# Patient Record
Sex: Female | Born: 1965
Health system: Southern US, Community
[De-identification: ages and names within clinical notes are randomized; demographics above are authoritative.]

## PROBLEM LIST (undated history)

## (undated) DIAGNOSIS — F419 Anxiety disorder, unspecified: Secondary | ICD-10-CM

## (undated) DIAGNOSIS — I10 Essential (primary) hypertension: Secondary | ICD-10-CM

## (undated) DIAGNOSIS — E785 Hyperlipidemia, unspecified: Secondary | ICD-10-CM

## (undated) HISTORY — PX: TUBAL LIGATION: SHX77

## (undated) HISTORY — DX: Essential (primary) hypertension: I10

## (undated) HISTORY — DX: Hyperlipidemia, unspecified: E78.5

## (undated) HISTORY — DX: Anxiety disorder, unspecified: F41.9

## (undated) HISTORY — PX: TONSILLECTOMY: SUR1361

---

## 2005-02-11 ENCOUNTER — Ambulatory Visit: Payer: Self-pay | Admitting: Family Medicine

## 2017-03-27 ENCOUNTER — Other Ambulatory Visit: Payer: Self-pay

## 2017-03-27 ENCOUNTER — Encounter: Payer: Self-pay | Admitting: Emergency Medicine

## 2017-03-27 ENCOUNTER — Emergency Department
Admission: EM | Admit: 2017-03-27 | Discharge: 2017-03-27 | Disposition: A | Discharge status: Home / Self Care | Payer: BLUE CROSS/BLUE SHIELD | Attending: Emergency Medicine | Admitting: Emergency Medicine

## 2017-03-27 DIAGNOSIS — M5432 Sciatica, left side: Secondary | ICD-10-CM | POA: Diagnosis not present

## 2017-03-27 DIAGNOSIS — F17200 Nicotine dependence, unspecified, uncomplicated: Secondary | ICD-10-CM | POA: Diagnosis not present

## 2017-03-27 DIAGNOSIS — M545 Low back pain: Secondary | ICD-10-CM | POA: Diagnosis present

## 2017-03-27 MED ORDER — METHOCARBAMOL 500 MG PO TABS: ORAL_TABLET | ORAL | 0 refills | Status: DC

## 2017-03-27 MED ORDER — PREDNISONE 10 MG PO TABS: ORAL_TABLET | ORAL | 0 refills | Status: DC

## 2017-03-27 MED ORDER — METHOCARBAMOL 500 MG PO TABS
1000.0000 mg | ORAL_TABLET | Freq: Once | ORAL | Status: AC
  Administered 2017-03-27: 1000 mg via ORAL
  Filled 2017-03-27: qty 2

## 2017-03-27 MED ORDER — KETOROLAC TROMETHAMINE 30 MG/ML IJ SOLN
30.0000 mg | Freq: Once | INTRAMUSCULAR | Status: AC
  Administered 2017-03-27: 30 mg via INTRAMUSCULAR
  Filled 2017-03-27: qty 1

## 2017-03-27 NOTE — Discharge Instructions (Signed)
With [facility] clinic or Dr. Posey Pronto who is the orthopedist on call for the Orthopedic Department. Begin taking prednisone as we discussed starting with 6 tablets today. You may also take Robaxin 1 tablet at at bedtime for muscle spasms as needed.

## 2017-03-27 NOTE — ED Notes (Signed)
Patient feels relief of symptoms when patient "leans" to the right

## 2017-03-27 NOTE — ED Provider Notes (Signed)
[facility] Emergency Department Provider Note  ____________________________________________   First MD Initiated Contact with Patient 03/27/17 1405     (approximate)  I have reviewed the triage vital signs and the nursing notes.   HISTORY  Chief Complaint Back Pain   HPI [place] is a 51 y.o. female  is here complaining of low back pain with left leg radiculopathy for approximately 3 weeks. Patient states he is gotten worse over the last 3-4 days.Patient states that the pain is worse when she is standing. Patient has been taking over-the-counter medications such as Aleve, Tylenol and BC back and body with minimal relief. Patient denies any recent injuries. She denies any incontinence of bowel or bladder, saddle anesthesias. currently she rates her pain as a 1/10.   History reviewed. No pertinent past medical history.  There are no active problems to display for this patient.   Past Surgical History:  Procedure Laterality Date  . CESAREAN SECTION    . TONSILLECTOMY      Prior to Admission medications   Medication Sig Start Date End Date Taking? Authorizing Provider  methocarbamol (ROBAXIN) 500 MG tablet Take 1 tablet at [facility] 03/27/17   Letitia Neri L, PA-C  predniSONE (DELTASONE) 10 MG tablet Take 6 tablets  today, on day 2 take 5 tablets, day 3 take 4 tablets, day 4 take 3 tablets, day 5 take  2 tablets and 1 tablet the last day 03/27/17   Johnn Hai, PA-C    Allergies Patient has no known allergies.  No family history on file.  Social History Social History   Tobacco Use  . Smoking status: Current Every Day Smoker  . Smokeless tobacco: Never Used  Substance Use Topics  . Alcohol use: No    Frequency: Never  . Drug use: No    Review of Systems Constitutional: No fever/chills Cardiovascular: Denies chest pain. Respiratory: Denies shortness of breath. Gastrointestinal: No abdominal pain.  No nausea, no vomiting.    Genitourinary: Negative for dysuria. Musculoskeletal: positive low back pain with left leg radiculopathy. Skin: Negative for rash. Neurological: Negative for  focal weakness or numbness. ___________________________________________   PHYSICAL EXAM:  VITAL SIGNS: ED Triage Vitals  Enc Vitals Group     BP 03/27/17 1306 (!) 174/94     Pulse Rate 03/27/17 1306 87     Resp --      Temp 03/27/17 1306 97.9 F (36.6 C)     Temp Source 03/27/17 1306 Oral     SpO2 03/27/17 1306 95 %     Weight 03/27/17 1307 225 lb (102.1 kg)     Height 03/27/17 1307 5\' 6"  (1.676 m)     Head Circumference --      Peak Flow --      Pain Score 03/27/17 1312 1     Pain Loc --      Pain Edu? --      Excl. in [place]? --    Constitutional: Alert and oriented. Well appearing and in no acute distress. Eyes: Conjunctivae are normal.  Head: Atraumatic. Neck: No stridor.   Cardiovascular: Normal rate, regular rhythm. Grossly normal heart sounds.  Good peripheral circulation. Respiratory: Normal respiratory effort.  No retractions. Lungs CTAB. Gastrointestinal: Soft and nontender. No distention. No abdominal bruits. No CVA tenderness. Musculoskeletal: Moves upper and lower extremities without any difficulty.Gait was not tested secondary to patient's low back pain. On examination of the back there is no gross deformity noted. There is  tenderness on palpation of the lower lumbar paravertebral muscles over to the left SI joint and surrounding soft tissue. Range of motion is with minimal restriction. Straight leg raises were negative.Good muscle strength bilaterally. Neurologic:  Normal speech and language. No gross focal neurologic deficits are appreciated. eflexes are 2+ bilaterally.No gait instability. Skin:  Skin is warm, dry and intact.  Psychiatric: Mood and affect are normal. Speech and behavior are normal.  ____________________________________________   LABS (all labs ordered are listed, but only abnormal  results are displayed)  Labs Reviewed - No data to display  ____________________________________________   PROCEDURES  Procedure(s) performed: None  Procedures  Critical Care performed: No  ____________________________________________   INITIAL IMPRESSION / ASSESSMENT AND PLAN / ED COURSE Discussed sciatica with patient and family members present. Patient was given Toradol 30 mg IM.patient was also given Robaxin thousand milligrams by mouth while in the department. Patient was discharged with a prescription for prednisone 60 mg 6 day taper along with Robaxin 1 tablet at bedtime. She is to follow-up with her PCP, [facility] clinic or with the orthopedic Department at [facility] clinic if any continued problems with her back or sciatica.   ____________________________________________   FINAL CLINICAL IMPRESSION(S) / ED DIAGNOSES  Final diagnoses:  Sciatica of left side     ED Discharge Orders        Ordered    predniSONE (DELTASONE) 10 MG tablet     03/27/17 1546    methocarbamol (ROBAXIN) 500 MG tablet     03/27/17 1546       Note:  This document was prepared using Dragon voice recognition software and may include unintentional dictation errors.    Johnn Hai, PA-C 03/27/17 1618    Lisa Roca, MD 03/31/17 [phone]

## 2017-03-27 NOTE — ED Triage Notes (Signed)
Pt to ED c/o lower back pain with left leg radiculopathy x 2 weeks but worsening over the past 3-4 days. Pt states that pain is worse when she stands. Pt in NAD at this time.

## 2019-07-10 ENCOUNTER — Ambulatory Visit: Payer: Self-pay | Admitting: Family Medicine

## 2019-07-12 ENCOUNTER — Other Ambulatory Visit (HOSPITAL_COMMUNITY)
Admission: RE | Admit: 2019-07-12 | Discharge: 2019-07-12 | Disposition: A | Discharge status: Home / Self Care | Payer: BC Managed Care – PPO | Source: Ambulatory Visit | Attending: Family Medicine | Admitting: Family Medicine

## 2019-07-12 ENCOUNTER — Ambulatory Visit: Payer: BC Managed Care – PPO | Admitting: Family Medicine

## 2019-07-12 ENCOUNTER — Other Ambulatory Visit: Payer: Self-pay

## 2019-07-12 ENCOUNTER — Encounter: Payer: Self-pay | Admitting: Family Medicine

## 2019-07-12 VITALS — BP 166/97 | HR 89 | Temp 98.2°F | Ht 64.0 in | Wt 224.0 lb

## 2019-07-12 DIAGNOSIS — N898 Other specified noninflammatory disorders of vagina: Secondary | ICD-10-CM | POA: Diagnosis not present

## 2019-07-12 DIAGNOSIS — Z114 Encounter for screening for human immunodeficiency virus [HIV]: Secondary | ICD-10-CM | POA: Diagnosis not present

## 2019-07-12 DIAGNOSIS — Z1231 Encounter for screening mammogram for malignant neoplasm of breast: Secondary | ICD-10-CM | POA: Diagnosis not present

## 2019-07-12 DIAGNOSIS — R030 Elevated blood-pressure reading, without diagnosis of hypertension: Secondary | ICD-10-CM

## 2019-07-12 DIAGNOSIS — Z1159 Encounter for screening for other viral diseases: Secondary | ICD-10-CM | POA: Diagnosis not present

## 2019-07-12 DIAGNOSIS — Z124 Encounter for screening for malignant neoplasm of cervix: Secondary | ICD-10-CM

## 2019-07-12 DIAGNOSIS — Z0000 Encounter for general adult medical examination without abnormal findings: Secondary | ICD-10-CM

## 2019-07-12 DIAGNOSIS — Z23 Encounter for immunization: Secondary | ICD-10-CM

## 2019-07-12 DIAGNOSIS — Z1211 Encounter for screening for malignant neoplasm of colon: Secondary | ICD-10-CM

## 2019-07-12 LAB — UA/M W/RFLX CULTURE, ROUTINE
Bilirubin, UA: NEGATIVE
Glucose, UA: NEGATIVE
Ketones, UA: NEGATIVE
Leukocytes,UA: NEGATIVE
Nitrite, UA: NEGATIVE
Protein,UA: NEGATIVE
Specific Gravity, UA: 1.02 (ref 1.005–1.030)
Urobilinogen, Ur: 0.2 mg/dL (ref 0.2–1.0)
pH, UA: 7 (ref 5.0–7.5)

## 2019-07-12 LAB — MICROSCOPIC EXAMINATION

## 2019-07-12 LAB — WET PREP FOR TRICH, YEAST, CLUE
Clue Cell Exam: POSITIVE — AB
Trichomonas Exam: NEGATIVE
Yeast Exam: NEGATIVE

## 2019-07-12 NOTE — Progress Notes (Signed)
BP (!) 166/97   Pulse 89   Temp 98.2 F (36.8 C) (Oral)   Ht 5\' 4"  (1.626 m)   Wt 224 lb (101.6 kg)   SpO2 98%   BMI 38.45 kg/m    Subjective:    Patient ID: Tiffany Boyer, female    DOB: January 07, 1966, 54 y.o.   MRN: [id number]  HPI: Tiffany Boyer is a 54 y.o. female presenting on 07/12/2019 for comprehensive medical examination. Current medical complaints include:see below  Patient presenting today to establish care.   No known medical problems, has not been to a doctor in 15 years per patient. Denies any new sxs or concerns.   She currently lives with: Menopausal Symptoms: no  Depression Screen done today and results listed below:  Depression screen [facility] 2/9 07/12/2019  Decreased Interest 2  Down, Depressed, Hopeless 0  PHQ - 2 Score 2  Altered sleeping 3  Tired, decreased energy 0  Change in appetite 1  Feeling bad or failure about yourself  0  Trouble concentrating 0  Moving slowly or fidgety/restless 0  Suicidal thoughts 0  PHQ-9 Score 6    The patient does not have a history of falls. I did complete a risk assessment for falls. A plan of care for falls was documented.   Past Medical History:  Past Medical History:  Diagnosis Date  . Hyperlipidemia     Surgical History:  Past Surgical History:  Procedure Laterality Date  . CESAREAN SECTION    . TONSILLECTOMY      Medications:  Current Outpatient Medications on File Prior to Visit  Medication Sig  . Multiple Vitamins-Minerals (WOMENS 50+ MULTI VITAMIN/MIN PO) Take by mouth daily.   No current facility-administered medications on file prior to visit.    Allergies:  No Known Allergies  Social History:  Social History   Socioeconomic History  . Marital status: Married    Spouse name: Not on file  . Number of children: Not on file  . Years of education: Not on file  . Highest education level: Not on file  Occupational History  . Not on file  Tobacco Use  . Smoking status: Current Every Day  Smoker    Packs/day: 0.50    Types: Cigarettes  . Smokeless tobacco: Never Used  Substance and Sexual Activity  . Alcohol use: No  . Drug use: No  . Sexual activity: Yes  Other Topics Concern  . Not on file  Social History Narrative  . Not on file   Social Determinants of Health   Financial Resource Strain:   . Difficulty of Paying Living Expenses: Not on file  Food Insecurity:   . Worried About Charity fundraiser in the Last Year: Not on file  . Ran Out of Food in the Last Year: Not on file  Transportation Needs:   . Lack of Transportation (Medical): Not on file  . Lack of Transportation (Non-Medical): Not on file  Physical Activity:   . Days of Exercise per Week: Not on file  . Minutes of Exercise per Session: Not on file  Stress:   . Feeling of Stress : Not on file  Social Connections:   . Frequency of Communication with Friends and Family: Not on file  . Frequency of Social Gatherings with Friends and Family: Not on file  . Attends Religious Services: Not on file  . Active Member of Clubs or Organizations: Not on file  . Attends Archivist Meetings: Not  on file  . Marital Status: Not on file  Intimate Partner Violence:   . Fear of Current or Ex-Partner: Not on file  . Emotionally Abused: Not on file  . Physically Abused: Not on file  . Sexually Abused: Not on file   Social History   Tobacco Use  Smoking Status Current Every Day Smoker  . Packs/day: 0.50  . Types: Cigarettes  Smokeless Tobacco Never Used   Social History   Substance and Sexual Activity  Alcohol Use No    Family History:  Family History  Problem Relation Age of Onset  . Hypertension Mother   . Stroke Brother     Past medical history, surgical history, medications, allergies, family history and social history reviewed with patient today and changes made to appropriate areas of the chart.   Review of Systems - General ROS: negative Psychological ROS: negative Ophthalmic  ROS: negative ENT ROS: negative Allergy and Immunology ROS: negative Hematological and Lymphatic ROS: negative Endocrine ROS: negative Breast ROS: negative for breast lumps Respiratory ROS: no cough, shortness of breath, or wheezing Cardiovascular ROS: no chest pain or dyspnea on exertion Gastrointestinal ROS: no abdominal pain, change in bowel habits, or black or bloody stools Genito-Urinary ROS: no dysuria, trouble voiding, or hematuria Musculoskeletal ROS: negative Neurological ROS: no TIA or stroke symptoms Dermatological ROS: negative All other ROS negative except what is listed above and in the HPI.      Objective:    BP (!) 166/97   Pulse 89   Temp 98.2 F (36.8 C) (Oral)   Ht 5\' 4"  (1.626 m)   Wt 224 lb (101.6 kg)   SpO2 98%   BMI 38.45 kg/m   Wt Readings from Last 3 Encounters:  07/12/19 224 lb (101.6 kg)  03/27/17 225 lb (102.1 kg)    Physical Exam Vitals and nursing note reviewed.  Constitutional:      General: She is not in acute distress.    Appearance: She is well-developed.  HENT:     Head: Atraumatic.     Right Ear: External ear normal.     Left Ear: External ear normal.     Nose: Nose normal.     Mouth/Throat:     Pharynx: No oropharyngeal exudate.  Eyes:     General: No scleral icterus.    Conjunctiva/sclera: Conjunctivae normal.     Pupils: Pupils are equal, round, and reactive to light.  Neck:     Thyroid: No thyromegaly.  Cardiovascular:     Rate and Rhythm: Normal rate and regular rhythm.     Heart sounds: Normal heart sounds.  Pulmonary:     Effort: Pulmonary effort is normal. No respiratory distress.     Breath sounds: Normal breath sounds.  Chest:     Breasts:        Right: No mass, skin change or tenderness.        Left: No mass, skin change or tenderness.  Abdominal:     General: Bowel sounds are normal.     Palpations: Abdomen is soft. There is no mass.     Tenderness: There is no abdominal tenderness.  Genitourinary:     General: Normal vulva.     Vagina: Vaginal discharge present.  Musculoskeletal:        General: No tenderness. Normal range of motion.     Cervical back: Normal range of motion and neck supple.  Lymphadenopathy:     Cervical: No cervical adenopathy.     Upper  Body:     Right upper body: No axillary adenopathy.     Left upper body: No axillary adenopathy.  Skin:    General: Skin is warm and dry.     Findings: No rash.  Neurological:     Mental Status: She is alert and oriented to person, place, and time.     Cranial Nerves: No cranial nerve deficit.  Psychiatric:        Behavior: Behavior normal.     Results for orders placed or performed in visit on 07/12/19  WET PREP FOR [place], YEAST, CLUE   Specimen: Vaginal Fluid   VAGINAL FLUI  Result Value Ref Range   Trichomonas Exam Negative Negative   Yeast Exam Negative Negative   Clue Cell Exam Positive (A) Negative  Microscopic Examination   VAGINAL FLUI  Result Value Ref Range   WBC, UA 0-5 0 - 5 /hpf   RBC 11-30 (A) 0 - 2 /hpf   Epithelial Cells (non renal) 0-10 0 - 10 /hpf   Casts Present None seen /lpf   Cast Type Granular casts (A) N/A   Crystals Present N/A   Crystal Type Amorphous Sediment N/A   Mucus, UA Present Not Estab.   Bacteria, UA Few (A) None seen/Few  UA/M w/rflx Culture, Routine   Specimen: Vaginal Fluid   VAGINAL FLUI  Result Value Ref Range   Specific Gravity, UA 1.020 1.005 - 1.030   pH, UA 7.0 5.0 - 7.5   Color, UA Yellow Yellow   Appearance Ur Hazy (A) Clear   Leukocytes,UA Negative Negative   Protein,UA Negative Negative/Trace   Glucose, UA Negative Negative   Ketones, UA Negative Negative   RBC, UA 2+ (A) Negative   Bilirubin, UA Negative Negative   Urobilinogen, Ur 0.2 0.2 - 1.0 mg/dL   Nitrite, UA Negative Negative   Microscopic Examination See below:       Assessment & Plan:   Problem List Items Addressed This Visit    None    Visit Diagnoses    Transient elevated blood pressure     -  Primary   Elevated today, will try to obtain home readings for several weeks and call with persistent abnormal readings. DASH diet, exercise reviewed   Annual physical exam       Relevant Orders   CBC with Differential/Platelet   Comprehensive metabolic panel   Lipid Panel w/o Chol/HDL Ratio   TSH   UA/M w/rflx Culture, Routine (Completed)   Vaginal discharge       Asymptomatic. Await wet prep results, treat as needed   Relevant Orders   WET PREP FOR [place], YEAST, CLUE (Completed)   Encounter for screening mammogram for malignant neoplasm of breast       Relevant Orders   MM DIGITAL SCREENING BILATERAL   Screening for HIV (human immunodeficiency virus)       Relevant Orders   HIV Antibody (routine testing w rflx)   Need for hepatitis C screening test       Relevant Orders   Hepatitis C antibody   Screening for colon cancer       Relevant Orders   Cologuard   Screening for cervical cancer       Relevant Orders   Cytology - PAP   Need for diphtheria-tetanus-pertussis (Tdap) vaccine       Relevant Orders   Tdap vaccine greater than or equal to 7yo IM (Completed)       Follow up plan: Return in  about 6 months (around 01/09/2020) for BP.   LABORATORY TESTING:  - Pap smear: pap done  IMMUNIZATIONS:   - Tdap: Tetanus vaccination status reviewed: Td vaccination indicated and given today. - Influenza: Refused  SCREENING: -Mammogram: Ordered today  - Colonoscopy: Ordered today   PATIENT COUNSELING:   Advised to take 1 mg of folate supplement per day if capable of pregnancy.   Sexuality: Discussed sexually transmitted diseases, partner selection, use of condoms, avoidance of unintended pregnancy  and contraceptive alternatives.   Advised to avoid cigarette smoking.  I discussed with the patient that most people either abstain from alcohol or drink within safe limits (<=14/week and <=4 drinks/occasion for males, <=7/weeks and <= 3 drinks/occasion for females) and that  the risk for alcohol disorders and other health effects rises proportionally with the number of drinks per week and how often a drinker exceeds daily limits.  Discussed cessation/primary prevention of drug use and availability of treatment for abuse.   Diet: Encouraged to adjust caloric intake to maintain  or achieve ideal body weight, to reduce intake of dietary saturated fat and total fat, to limit sodium intake by avoiding high sodium foods and not adding table salt, and to maintain adequate dietary potassium and calcium preferably from fresh fruits, vegetables, and low-fat dairy products.    stressed the importance of regular exercise  Injury prevention: Discussed safety belts, safety helmets, smoke detector, smoking near bedding or upholstery.   Dental health: Discussed importance of regular tooth brushing, flossing, and dental visits.    NEXT PREVENTATIVE PHYSICAL DUE IN 1 YEAR. Return in about 6 months (around 01/09/2020) for BP.

## 2019-07-12 NOTE — Progress Notes (Deleted)
   BP (!) 157/83   Pulse 87   Temp 98.2 F (36.8 C) (Oral)   Ht 5\' 4"  (1.626 m)   Wt 224 lb (101.6 kg)   SpO2 98%   BMI 38.45 kg/m    Subjective:    Patient ID: Tiffany Boyer, female    DOB: May 23, 1965, 54 y.o.   MRN: [id number]  HPI: Tiffany Boyer is a 54 y.o. female  Chief Complaint  Patient presents with  . Establish Care     Relevant past medical, surgical, family and social history reviewed and updated as indicated. Interim medical history since our last visit reviewed. Allergies and medications reviewed and updated.  Review of Systems  Per HPI unless specifically indicated above     Objective:    BP (!) 157/83   Pulse 87   Temp 98.2 F (36.8 C) (Oral)   Ht 5\' 4"  (1.626 m)   Wt 224 lb (101.6 kg)   SpO2 98%   BMI 38.45 kg/m   Wt Readings from Last 3 Encounters:  07/12/19 224 lb (101.6 kg)  03/27/17 225 lb (102.1 kg)    Physical Exam  No results found for this or any previous visit.    Assessment & Plan:   Problem List Items Addressed This Visit    None       Follow up plan: No follow-ups on file.

## 2019-07-12 NOTE — Patient Instructions (Signed)
Please call this number to schedule your mammogram. [phone] 

## 2019-07-13 LAB — TSH: TSH: 1.1 u[IU]/mL (ref 0.450–4.500)

## 2019-07-13 LAB — CBC WITH DIFFERENTIAL/PLATELET
Basophils Absolute: 0 10*3/uL (ref 0.0–0.2)
Basos: 1 %
EOS (ABSOLUTE): 0.1 10*3/uL (ref 0.0–0.4)
Eos: 2 %
Hematocrit: 41.6 % (ref 34.0–46.6)
Hemoglobin: 13.9 g/dL (ref 11.1–15.9)
Immature Grans (Abs): 0 10*3/uL (ref 0.0–0.1)
Immature Granulocytes: 0 %
Lymphocytes Absolute: 1.7 10*3/uL (ref 0.7–3.1)
Lymphs: 27 %
MCH: 28.7 pg (ref 26.6–33.0)
MCHC: 33.4 g/dL (ref 31.5–35.7)
MCV: 86 fL (ref 79–97)
Monocytes Absolute: 0.3 10*3/uL (ref 0.1–0.9)
Monocytes: 5 %
Neutrophils Absolute: 4.4 10*3/uL (ref 1.4–7.0)
Neutrophils: 65 %
Platelets: 210 10*3/uL (ref 150–450)
RBC: 4.84 x10E6/uL (ref 3.77–5.28)
RDW: 13.8 % (ref 11.7–15.4)
WBC: 6.6 10*3/uL (ref 3.4–10.8)

## 2019-07-13 LAB — COMPREHENSIVE METABOLIC PANEL
ALT: 22 IU/L (ref 0–32)
AST: 31 IU/L (ref 0–40)
Albumin/Globulin Ratio: 1.6 (ref 1.2–2.2)
Albumin: 4.3 g/dL (ref 3.8–4.9)
Alkaline Phosphatase: 116 IU/L (ref 39–117)
BUN/Creatinine Ratio: 15 (ref 9–23)
BUN: 10 mg/dL (ref 6–24)
Bilirubin Total: 0.4 mg/dL (ref 0.0–1.2)
CO2: 21 mmol/L (ref 20–29)
Calcium: 9.2 mg/dL (ref 8.7–10.2)
Chloride: 105 mmol/L (ref 96–106)
Creatinine, Ser: 0.66 mg/dL (ref 0.57–1.00)
GFR calc Af Amer: 115 mL/min/{1.73_m2} (ref >59)
GFR calc non Af Amer: 100 mL/min/{1.73_m2} (ref >59)
Globulin, Total: 2.7 g/dL (ref 1.5–4.5)
Glucose: 122 mg/dL — ABNORMAL HIGH (ref 65–99)
Potassium: 4.2 mmol/L (ref 3.5–5.2)
Sodium: 142 mmol/L (ref 134–144)
Total Protein: 7 g/dL (ref 6.0–8.5)

## 2019-07-13 LAB — LIPID PANEL W/O CHOL/HDL RATIO
Cholesterol, Total: 250 mg/dL — ABNORMAL HIGH (ref 100–199)
HDL: 47 mg/dL (ref >39)
LDL Chol Calc (NIH): 171 mg/dL — ABNORMAL HIGH (ref 0–99)
Triglycerides: 175 mg/dL — ABNORMAL HIGH (ref 0–149)
VLDL Cholesterol Cal: 32 mg/dL (ref 5–40)

## 2019-07-13 LAB — HIV ANTIBODY (ROUTINE TESTING W REFLEX): HIV Screen 4th Generation wRfx: NONREACTIVE

## 2019-07-13 LAB — HEPATITIS C ANTIBODY: Hep C Virus Ab: <0.1 s/co ratio (ref 0.0–0.9)

## 2019-07-17 ENCOUNTER — Encounter: Payer: Self-pay | Admitting: Family Medicine

## 2019-07-18 ENCOUNTER — Telehealth: Payer: Self-pay | Admitting: Family Medicine

## 2019-07-18 ENCOUNTER — Encounter: Payer: Self-pay | Admitting: Family Medicine

## 2019-07-18 LAB — CYTOLOGY - PAP
Adequacy: ABSENT
Comment: NEGATIVE
Diagnosis: NEGATIVE
High risk HPV: NEGATIVE

## 2019-07-18 MED ORDER — ATORVASTATIN CALCIUM 20 MG PO TABS: 20.0000 mg | ORAL_TABLET | Freq: Every day | ORAL | 0 refills | Status: DC

## 2019-07-18 MED ORDER — METRONIDAZOLE 500 MG PO TABS: 500.0000 mg | ORAL_TABLET | Freq: Two times a day (BID) | ORAL | 0 refills | Status: DC

## 2019-07-18 NOTE — Telephone Encounter (Signed)
Called pt and discussed lab results and her home BP logs  BPs - Home BPs running high normal - above normal but she's now started cutting back significantly on soft drinks, sweats, and exercising more. Will continue to monitor with these changes  Elevated BSs - will check A1C at upcoming f/u, continue diet and exercise changes  Elevated cholesterol - will start lipitor and recheck things in 3 months, continue lifestyle changes  BV - start flagyl course, probiotics   **Please schedule for 3 month f/u

## 2019-07-19 NOTE — Telephone Encounter (Signed)
Done

## 2019-08-05 DIAGNOSIS — Z1211 Encounter for screening for malignant neoplasm of colon: Secondary | ICD-10-CM | POA: Diagnosis not present

## 2019-08-09 LAB — COLOGUARD: Cologuard: POSITIVE — AB

## 2019-08-10 ENCOUNTER — Telehealth: Payer: Self-pay

## 2019-08-10 DIAGNOSIS — R195 Other fecal abnormalities: Secondary | ICD-10-CM

## 2019-08-10 NOTE — Telephone Encounter (Signed)
Please call and let her know and see if she's ok with me ordering a Colonoscopy to follow up on the result

## 2019-08-10 NOTE — Telephone Encounter (Signed)
Receive e-mail from [facility].  Patient's cologuard is positive. Printing result, and entering in chart.   Routing to provider.

## 2019-08-10 NOTE — Telephone Encounter (Signed)
Patient notified of result. Is ok with having colonoscopy done, routing back for referral.

## 2019-08-10 NOTE — Telephone Encounter (Signed)
Referral generated

## 2019-08-14 ENCOUNTER — Encounter: Payer: Self-pay | Admitting: Family Medicine

## 2019-08-16 ENCOUNTER — Telehealth: Payer: Self-pay | Admitting: Family Medicine

## 2019-08-16 ENCOUNTER — Other Ambulatory Visit: Payer: Self-pay

## 2019-08-16 ENCOUNTER — Encounter: Payer: Self-pay | Admitting: Family Medicine

## 2019-08-16 ENCOUNTER — Ambulatory Visit (INDEPENDENT_AMBULATORY_CARE_PROVIDER_SITE_OTHER): Payer: BC Managed Care – PPO | Admitting: Family Medicine

## 2019-08-16 VITALS — BP 153/99 | HR 86 | Temp 98.6°F | Ht 64.37 in | Wt 208.6 lb

## 2019-08-16 DIAGNOSIS — M545 Low back pain: Secondary | ICD-10-CM

## 2019-08-16 DIAGNOSIS — G8929 Other chronic pain: Secondary | ICD-10-CM | POA: Diagnosis not present

## 2019-08-16 MED ORDER — PREDNISONE 10 MG PO TABS: ORAL_TABLET | ORAL | 0 refills | Status: DC

## 2019-08-16 MED ORDER — CYCLOBENZAPRINE HCL 10 MG PO TABS: 10.0000 mg | ORAL_TABLET | Freq: Three times a day (TID) | ORAL | 0 refills | Status: DC | PRN

## 2019-08-16 NOTE — Progress Notes (Signed)
BP (!) 153/99 (BP Location: Right Arm, Patient Position: Sitting, Cuff Size: Normal)   Pulse 86   Temp 98.6 F (37 C) (Oral)   Ht 5' 4.37" (1.635 m)   Wt 208 lb 9.6 oz (94.6 kg)   SpO2 97%   BMI 35.40 kg/m    Subjective:    Patient ID: Tiffany Boyer, female    DOB: 1965-09-15, 54 y.o.   MRN: [id number]  HPI: Tiffany Boyer is a 54 y.o. female  Chief Complaint  Patient presents with  . Back Pain    lower back, 2 years on going, worsened last friday.    Several years of low back pain, works a very physical job which seems to make it worse. Had a severe flare in 2018, given steroids in ER which helped some and was referred to Orthopedics at that time. Dx'd with lumbar spondylosis and given more steroids, PT. Feels the pain is tolerable if not in a flare. Has been in a flare for about a week now after getting up from laying down the wrong way and lifting a few heavy things. Sometimes radiates to the right buttock but no incontinence, numbness or tingling, leg weakness. Taking bayer back and body with mild relief.   Relevant past medical, surgical, family and social history reviewed and updated as indicated. Interim medical history since our last visit reviewed. Allergies and medications reviewed and updated.  Review of Systems  Per HPI unless specifically indicated above     Objective:    BP (!) 153/99 (BP Location: Right Arm, Patient Position: Sitting, Cuff Size: Normal)   Pulse 86   Temp 98.6 F (37 C) (Oral)   Ht 5' 4.37" (1.635 m)   Wt 208 lb 9.6 oz (94.6 kg)   SpO2 97%   BMI 35.40 kg/m   Wt Readings from Last 3 Encounters:  08/16/19 208 lb 9.6 oz (94.6 kg)  07/12/19 224 lb (101.6 kg)  03/27/17 225 lb (102.1 kg)    Physical Exam Vitals and nursing note reviewed.  Constitutional:      Appearance: Normal appearance. She is not ill-appearing.  HENT:     Head: Atraumatic.  Eyes:     Extraocular Movements: Extraocular movements intact.     Conjunctiva/sclera:  Conjunctivae normal.  Cardiovascular:     Rate and Rhythm: Normal rate and regular rhythm.     Heart sounds: Normal heart sounds.  Pulmonary:     Effort: Pulmonary effort is normal.     Breath sounds: Normal breath sounds.  Musculoskeletal:        General: Tenderness (mild ttp right lumbar paraspinal muscles) present. Normal range of motion.     Cervical back: Normal range of motion and neck supple.  Skin:    General: Skin is warm and dry.  Neurological:     Mental Status: She is alert and oriented to person, place, and time.  Psychiatric:        Mood and Affect: Mood normal.        Thought Content: Thought content normal.        Judgment: Judgment normal.     Results for orders placed or performed in visit on 08/10/19  Cologuard  Result Value Ref Range   Cologuard Positive (A) Negative      Assessment & Plan:   Problem List Items Addressed This Visit    None    Visit Diagnoses    Chronic right-sided low back pain without sciatica    -  Primary   Tx with prednisone, flexeril, stretches, heat, rest. May require more PT as it's been a few years since last round   Relevant Medications   predniSONE (DELTASONE) 10 MG tablet   cyclobenzaprine (FLEXERIL) 10 MG tablet       Follow up plan: Return if symptoms worsen or fail to improve.

## 2019-08-16 NOTE — Telephone Encounter (Signed)
Copied from [place] [phone]. Topic: Quick Communication - See Telephone Encounter >> Aug 16, 2019 10:07 AM Loma [place] wrote: By [employer] for notification. See Telephone encounter for: 08/16/19. Abnormal result from [employer]. Fax sent over on 25th by [employer]. Wanted to make sure results were received. If not... call [phone] and follow prompts for provider support. "This is confirmation that you receive abnormal results if no return call"

## 2019-08-16 NOTE — Telephone Encounter (Signed)
Results were received and documented.

## 2019-08-20 ENCOUNTER — Ambulatory Visit (INDEPENDENT_AMBULATORY_CARE_PROVIDER_SITE_OTHER): Payer: Self-pay | Admitting: Gastroenterology

## 2019-08-20 DIAGNOSIS — R195 Other fecal abnormalities: Secondary | ICD-10-CM

## 2019-08-20 MED ORDER — NA SULFATE-K SULFATE-MG SULF 17.5-3.13-1.6 GM/177ML PO SOLN: 1.0000 | Freq: Once | ORAL | 0 refills | Status: AC

## 2019-08-20 NOTE — Progress Notes (Signed)
Gastroenterology Pre-Procedure Review  Request Date: Tuesday 09/04/19 Requesting Physician: Dr. Vicente Males  PATIENT REVIEW QUESTIONS: The patient responded to the following health history questions as indicated:    1. Are you having any GI issues? no 2. Do you have a personal history of Polyps? no 3. Do you have a family history of Colon Cancer or Polyps? no 4. Diabetes Mellitus? no 5. Joint replacements in the past 12 months?no 6. Major health problems in the past 3 months?no 7. Any artificial heart valves, MVP, or defibrillator?no    MEDICATIONS & ALLERGIES:    Patient reports the following regarding taking any anticoagulation/antiplatelet therapy:   Plavix, Coumadin, Eliquis, Xarelto, Lovenox, Pradaxa, Brilinta, or Effient? no Aspirin? no  Patient confirms/reports the following medications:  Current Outpatient Medications  Medication Sig Dispense Refill  . atorvastatin (LIPITOR) 20 MG tablet Take 1 tablet (20 mg total) by mouth daily. 90 tablet 0  . cyclobenzaprine (FLEXERIL) 10 MG tablet Take 1 tablet (10 mg total) by mouth 3 (three) times daily as needed for muscle spasms. 30 tablet 0  . Multiple Vitamins-Minerals (WOMENS 50+ MULTI VITAMIN/MIN PO) Take by mouth daily.    . Na Sulfate-K Sulfate-Mg Sulf 17.5-3.13-1.6 GM/177ML SOLN Take 1 kit by mouth once for 1 dose. 354 mL 0  . predniSONE (DELTASONE) 10 MG tablet Take 6 tabs day one, 5 tabs day two, 4 tabs day three, etc 21 tablet 0   No current facility-administered medications for this visit.    Patient confirms/reports the following allergies:  No Known Allergies  No orders of the defined types were placed in this encounter.   AUTHORIZATION INFORMATION Primary Insurance: 1D#: Group #:  Secondary Insurance: 1D#: Group #:  SCHEDULE INFORMATION: Date: Tuesday 09/04/19 Time: Location:[facility]

## 2019-08-31 ENCOUNTER — Other Ambulatory Visit
Admission: RE | Admit: 2019-08-31 | Discharge: 2019-08-31 | Disposition: A | Discharge status: Home / Self Care | Payer: BC Managed Care – PPO | Source: Ambulatory Visit | Attending: Gastroenterology | Admitting: Gastroenterology

## 2019-08-31 DIAGNOSIS — Z20822 Contact with and (suspected) exposure to covid-19: Secondary | ICD-10-CM | POA: Insufficient documentation

## 2019-08-31 DIAGNOSIS — Z01812 Encounter for preprocedural laboratory examination: Secondary | ICD-10-CM | POA: Diagnosis not present

## 2019-08-31 LAB — SARS CORONAVIRUS 2 (TAT 6-24 HRS): SARS Coronavirus 2: NEGATIVE

## 2019-09-04 ENCOUNTER — Other Ambulatory Visit: Payer: Self-pay

## 2019-09-04 ENCOUNTER — Ambulatory Visit: Payer: BC Managed Care – PPO

## 2019-09-04 ENCOUNTER — Ambulatory Visit
Admission: RE | Admit: 2019-09-04 | Discharge: 2019-09-04 | Disposition: A | Discharge status: Home / Self Care | Payer: BC Managed Care – PPO | Attending: Gastroenterology | Admitting: Gastroenterology

## 2019-09-04 ENCOUNTER — Encounter
Admission: RE | Disposition: A | Discharge status: Home / Self Care | Payer: Self-pay | Source: Home / Self Care | Attending: Gastroenterology

## 2019-09-04 ENCOUNTER — Encounter: Payer: Self-pay | Admitting: Gastroenterology

## 2019-09-04 DIAGNOSIS — Z79899 Other long term (current) drug therapy: Secondary | ICD-10-CM | POA: Insufficient documentation

## 2019-09-04 DIAGNOSIS — D125 Benign neoplasm of sigmoid colon: Secondary | ICD-10-CM | POA: Insufficient documentation

## 2019-09-04 DIAGNOSIS — E785 Hyperlipidemia, unspecified: Secondary | ICD-10-CM | POA: Insufficient documentation

## 2019-09-04 DIAGNOSIS — F17210 Nicotine dependence, cigarettes, uncomplicated: Secondary | ICD-10-CM | POA: Insufficient documentation

## 2019-09-04 DIAGNOSIS — K635 Polyp of colon: Secondary | ICD-10-CM | POA: Diagnosis not present

## 2019-09-04 DIAGNOSIS — R195 Other fecal abnormalities: Secondary | ICD-10-CM | POA: Insufficient documentation

## 2019-09-04 DIAGNOSIS — D123 Benign neoplasm of transverse colon: Secondary | ICD-10-CM | POA: Insufficient documentation

## 2019-09-04 DIAGNOSIS — K621 Rectal polyp: Secondary | ICD-10-CM | POA: Insufficient documentation

## 2019-09-04 DIAGNOSIS — D122 Benign neoplasm of ascending colon: Secondary | ICD-10-CM | POA: Diagnosis not present

## 2019-09-04 HISTORY — PX: COLONOSCOPY WITH PROPOFOL: SHX5780

## 2019-09-04 SURGERY — COLONOSCOPY WITH PROPOFOL
Anesthesia: General

## 2019-09-04 MED ORDER — LIDOCAINE HCL (PF) 2 % IJ SOLN
INTRAMUSCULAR | Status: AC
  Filled 2019-09-04: qty 5

## 2019-09-04 MED ORDER — SODIUM CHLORIDE 0.9 % IV SOLN
INTRAVENOUS | Status: DC
  Administered 2019-09-04: 1000 mL via INTRAVENOUS

## 2019-09-04 MED ORDER — PROPOFOL 10 MG/ML IV BOLUS
INTRAVENOUS | Status: DC | PRN
  Administered 2019-09-04: 80 mg via INTRAVENOUS
  Administered 2019-09-04: 20 mg via INTRAVENOUS
  Administered 2019-09-04: 10 mg via INTRAVENOUS

## 2019-09-04 MED ORDER — LIDOCAINE HCL (CARDIAC) PF 100 MG/5ML IV SOSY
PREFILLED_SYRINGE | INTRAVENOUS | Status: DC | PRN
  Administered 2019-09-04: 100 mg via INTRAVENOUS

## 2019-09-04 MED ORDER — PROPOFOL 500 MG/50ML IV EMUL
INTRAVENOUS | Status: AC
  Filled 2019-09-04: qty 50

## 2019-09-04 MED ORDER — PROPOFOL 500 MG/50ML IV EMUL
INTRAVENOUS | Status: DC | PRN
  Administered 2019-09-04: 110 ug/kg/min via INTRAVENOUS

## 2019-09-04 NOTE — Op Note (Signed)
[facility] Gastroenterology Patient Name: Tiffany Boyer Procedure Date: 09/04/2019 8:42 AM MRN: [id number] Account #: [id number] Date of Birth: 04-27-1966 Admit Type: Outpatient Age: 54 Room: [facility] ENDO ROOM 1 Gender: Female Note Status: Finalized Procedure:             Colonoscopy Indications:           Positive Cologuard test Providers:             Jonathon Bellows MD, MD Medicines:             Monitored Anesthesia Care Complications:         No immediate complications. Procedure:             Pre-Anesthesia Assessment:                        - Prior to the procedure, a History and Physical was                         performed, and patient medications, allergies and                         sensitivities were reviewed. The patient's tolerance                         of previous anesthesia was reviewed.                        - The risks and benefits of the procedure and the                         sedation options and risks were discussed with the                         patient. All questions were answered and informed                         consent was obtained.                        - ASA Grade Assessment: II - A patient with mild                         systemic disease.                        After obtaining informed consent, the colonoscope was                         passed under direct vision. Throughout the procedure,                         the patient's blood pressure, pulse, and oxygen                         saturations were monitored continuously. The                         Colonoscope was introduced through the anus and  advanced to the the cecum, identified by the                         appendiceal orifice. The colonoscopy was performed                         with ease. The patient tolerated the procedure well.                         The quality of the bowel preparation was excellent. Findings:      The perianal and digital  rectal examinations were normal.      Four sessile polyps were found in the transverse colon and ascending       colon. The polyps were 4 to 7 mm in size. These polyps were removed with       a cold snare. Resection and retrieval were complete.      Three sessile polyps were found in the sigmoid colon. The polyps were 4       to 6 mm in size. These polyps were removed with a cold snare. Resection       and retrieval were complete.      A 5 mm polyp was found in the rectum. The polyp was sessile. The polyp       was removed with a cold snare. Resection and retrieval were complete.      The exam was otherwise without abnormality on direct and retroflexion       views. Impression:            - Four 4 to 7 mm polyps in the transverse colon and in                         the ascending colon, removed with a cold snare.                         Resected and retrieved.                        - Three 4 to 6 mm polyps in the sigmoid colon, removed                         with a cold snare. Resected and retrieved.                        - One 5 mm polyp in the rectum, removed with a cold                         snare. Resected and retrieved.                        - The examination was otherwise normal on direct and                         retroflexion views. Recommendation:        - Discharge patient to home (with escort).                        - Resume previous diet.                        -  Continue present medications.                        - Await pathology results.                        - Repeat colonoscopy in 3 years for surveillance. Procedure Code(s):     --- Professional ---                        [id number], Colonoscopy, flexible; with removal of                         tumor(s), polyp(s), or other lesion(s) by snare                         technique Diagnosis Code(s):     --- Professional ---                        K63.5, Polyp of colon                        K62.1, Rectal polyp                         R19.5, Other fecal abnormalities CPT copyright 2019 American Medical Association. All rights reserved. The codes documented in this report are preliminary and upon coder review may  be revised to meet current compliance requirements. Jonathon Bellows, MD Jonathon Bellows MD, MD 09/04/2019 9:16:15 AM This report has been signed electronically. Number of Addenda: 0 Note Initiated On: 09/04/2019 8:42 AM Scope Withdrawal Time: 0 hours 17 minutes 12 seconds  Total Procedure Duration: 0 hours 21 minutes 34 seconds  Estimated Blood Loss:  Estimated blood loss: none.      Dr [facility]

## 2019-09-04 NOTE — Transfer of Care (Signed)
Immediate Anesthesia Transfer of Care Note  Patient: Tiffany Boyer  Procedure(s) Performed: COLONOSCOPY WITH PROPOFOL (N/A )  Patient Location: PACU  Anesthesia Type:MAC  Level of Consciousness: awake and drowsy  Airway & Oxygen Therapy: Patient Spontanous Breathing and Patient connected to face mask oxygen  Post-op Assessment: Report given to RN and Post -op Vital signs reviewed and stable  Post vital signs: Reviewed and stable  Last Vitals:  Vitals Value Taken Time  BP 101/60  09/04/19 0920  Temp    Pulse 117 09/04/19 0921  Resp 16 09/04/19 0921  SpO2 97 % 09/04/19 0921  Vitals shown include unvalidated device data.  Last Pain:  Vitals:   09/04/19 0813  TempSrc: Tympanic  PainSc: 0-No pain         Complications: No apparent anesthesia complications

## 2019-09-04 NOTE — Anesthesia Postprocedure Evaluation (Signed)
Anesthesia Post Note  Patient: Tiffany Boyer  Procedure(s) Performed: COLONOSCOPY WITH PROPOFOL (N/A )  Patient location during evaluation: Endoscopy Anesthesia Type: General Level of consciousness: awake and alert Pain management: pain level controlled Vital Signs Assessment: post-procedure vital signs reviewed and stable Respiratory status: spontaneous breathing and respiratory function stable Cardiovascular status: stable Anesthetic complications: no     Last Vitals:  Vitals:   09/04/19 0939 09/04/19 0949  BP: 109/79 (!) 110/93  Pulse: 78 78  Resp: (!) 21 19  Temp:    SpO2: 98% 98%    Last Pain:  Vitals:   09/04/19 0949  TempSrc:   PainSc: 0-No pain                 Tiffany Boyer

## 2019-09-04 NOTE — Anesthesia Preprocedure Evaluation (Signed)
Anesthesia Evaluation  Patient identified by MRN, date of birth, ID band Patient awake    Reviewed: Allergy & Precautions, NPO status , Patient's Chart, lab work & pertinent test results  History of Anesthesia Complications Negative for: history of anesthetic complications  Airway Mallampati: III       Dental   Pulmonary neg sleep apnea, neg COPD, Current Smoker,           Cardiovascular (-) hypertension(-) Past MI and (-) CHF (-) dysrhythmias (-) Valvular Problems/Murmurs     Neuro/Psych neg Seizures    GI/Hepatic Neg liver ROS, neg GERD  ,  Endo/Other  neg diabetes  Renal/GU negative Renal ROS     Musculoskeletal   Abdominal   Peds  Hematology   Anesthesia Other Findings   Reproductive/Obstetrics                             Anesthesia Physical Anesthesia Plan  ASA: II  Anesthesia Plan: General   Post-op Pain Management:    Induction: Intravenous  PONV Risk Score and Plan: 2 and Propofol infusion and TIVA  Airway Management Planned: Nasal Cannula  Additional Equipment:   Intra-op Plan:   Post-operative Plan:   Informed Consent: I have reviewed the patients History and Physical, chart, labs and discussed the procedure including the risks, benefits and alternatives for the proposed anesthesia with the patient or authorized representative who has indicated his/her understanding and acceptance.       Plan Discussed with:   Anesthesia Plan Comments:         Anesthesia Quick Evaluation

## 2019-09-04 NOTE — H&P (Signed)
Jonathon Bellows, MD [address], [place], [place], [place], [address] [address], [place], [place], [place], [address] Phone: [phone]  Fax: [phone]  Primary Care Physician:  Volney American, PA-C   Pre-Procedure History & Physical: HPI:  Gibson Gatta Pulido is a 54 y.o. female is here for an colonoscopy.   Past Medical History:  Diagnosis Date  . Hyperlipidemia     Past Surgical History:  Procedure Laterality Date  . CESAREAN SECTION    . TONSILLECTOMY      Prior to Admission medications   Medication Sig Start Date End Date Taking? Authorizing Provider  atorvastatin (LIPITOR) 20 MG tablet Take 1 tablet (20 mg total) by mouth daily. 07/18/19   Volney American, PA-C  cyclobenzaprine (FLEXERIL) 10 MG tablet Take 1 tablet (10 mg total) by mouth 3 (three) times daily as needed for muscle spasms. 08/16/19   Volney American, PA-C  Multiple Vitamins-Minerals (WOMENS 50+ MULTI VITAMIN/MIN PO) Take by mouth daily.    [provider]    Allergies as of 08/20/2019  . (No Known Allergies)    Family History  Problem Relation Age of Onset  . Hypertension Mother   . Stroke Brother     Social History   Socioeconomic History  . Marital status: Married    Spouse name: Not on file  . Number of children: Not on file  . Years of education: Not on file  . Highest education level: Not on file  Occupational History  . Not on file  Tobacco Use  . Smoking status: Current Every Day Smoker    Packs/day: 0.50    Types: Cigarettes  . Smokeless tobacco: Never Used  Substance and Sexual Activity  . Alcohol use: No  . Drug use: No  . Sexual activity: Yes  Other Topics Concern  . Not on file  Social History Narrative  . Not on file   Social Determinants of Health   Financial Resource Strain:   . Difficulty of Paying Living Expenses:   Food Insecurity:   . Worried About Charity fundraiser in the Last Year:   . Arboriculturist in  the Last Year:   Transportation Needs:   . Film/video editor (Medical):   Marland Kitchen Lack of Transportation (Non-Medical):   Physical Activity:   . Days of Exercise per Week:   . Minutes of Exercise per Session:   Stress:   . Feeling of Stress :   Social Connections:   . Frequency of Communication with Friends and Family:   . Frequency of Social Gatherings with Friends and Family:   . Attends Religious Services:   . Active Member of Clubs or Organizations:   . Attends Archivist Meetings:   Marland Kitchen Marital Status:   Intimate Partner Violence:   . Fear of Current or Ex-Partner:   . Emotionally Abused:   Marland Kitchen Physically Abused:   . Sexually Abused:     Review of Systems: See HPI, otherwise negative ROS  Physical Exam: BP (!) 181/95   Pulse 89   Temp 97.8 F (36.6 C) (Tympanic)   Resp 18   Ht 5\' 6"  (1.676 m)   Wt 90.7 kg   SpO2 99%   BMI 32.28 kg/m  General:   Alert,  pleasant and cooperative in NAD Head:  Normocephalic and atraumatic. Neck:  Supple; no masses or thyromegaly. Lungs:  Clear throughout to auscultation, normal respiratory  effort.    Heart:  +S1, +S2, Regular rate and rhythm, No edema. Abdomen:  Soft, nontender and nondistended. Normal bowel sounds, without guarding, and without rebound.   Neurologic:  Alert and  oriented x4;  grossly normal neurologically.  Impression/Plan: Willa Rough Escandon is here for an colonoscopy to be performed for Screening colonoscopy cologuard test positive Risks, benefits, limitations, and alternatives regarding  colonoscopy have been reviewed with the patient.  Questions have been answered.  All parties agreeable.   Jonathon Bellows, MD  09/04/2019, 8:38 AM

## 2019-09-05 ENCOUNTER — Encounter: Payer: Self-pay | Admitting: *Deleted

## 2019-09-05 LAB — SURGICAL PATHOLOGY

## 2019-09-09 ENCOUNTER — Encounter: Payer: Self-pay | Admitting: Gastroenterology

## 2019-09-19 ENCOUNTER — Other Ambulatory Visit: Payer: Self-pay | Admitting: Family Medicine

## 2019-09-19 DIAGNOSIS — Z1231 Encounter for screening mammogram for malignant neoplasm of breast: Secondary | ICD-10-CM

## 2019-09-20 ENCOUNTER — Ambulatory Visit
Admission: RE | Admit: 2019-09-20 | Discharge: 2019-09-20 | Disposition: A | Discharge status: Home / Self Care | Payer: BC Managed Care – PPO | Source: Ambulatory Visit | Attending: Family Medicine | Admitting: Family Medicine

## 2019-09-20 DIAGNOSIS — Z1231 Encounter for screening mammogram for malignant neoplasm of breast: Secondary | ICD-10-CM | POA: Diagnosis not present

## 2019-09-24 ENCOUNTER — Other Ambulatory Visit: Payer: Self-pay | Admitting: Family Medicine

## 2019-09-24 DIAGNOSIS — N6489 Other specified disorders of breast: Secondary | ICD-10-CM

## 2019-09-24 DIAGNOSIS — R928 Other abnormal and inconclusive findings on diagnostic imaging of breast: Secondary | ICD-10-CM

## 2019-09-27 ENCOUNTER — Encounter: Payer: Self-pay | Admitting: Family Medicine

## 2019-10-02 DIAGNOSIS — L578 Other skin changes due to chronic exposure to nonionizing radiation: Secondary | ICD-10-CM | POA: Diagnosis not present

## 2019-10-02 DIAGNOSIS — Z872 Personal history of diseases of the skin and subcutaneous tissue: Secondary | ICD-10-CM | POA: Diagnosis not present

## 2019-10-02 DIAGNOSIS — Z859 Personal history of malignant neoplasm, unspecified: Secondary | ICD-10-CM | POA: Diagnosis not present

## 2019-10-02 DIAGNOSIS — L821 Other seborrheic keratosis: Secondary | ICD-10-CM | POA: Diagnosis not present

## 2019-10-02 DIAGNOSIS — L02212 Cutaneous abscess of back [any part, except buttock]: Secondary | ICD-10-CM | POA: Diagnosis not present

## 2019-10-04 ENCOUNTER — Ambulatory Visit
Admission: RE | Admit: 2019-10-04 | Discharge: 2019-10-04 | Disposition: A | Discharge status: Home / Self Care | Payer: BC Managed Care – PPO | Source: Ambulatory Visit | Attending: Family Medicine | Admitting: Family Medicine

## 2019-10-04 DIAGNOSIS — R928 Other abnormal and inconclusive findings on diagnostic imaging of breast: Secondary | ICD-10-CM | POA: Insufficient documentation

## 2019-10-04 DIAGNOSIS — N6489 Other specified disorders of breast: Secondary | ICD-10-CM

## 2019-10-04 DIAGNOSIS — N6011 Diffuse cystic mastopathy of right breast: Secondary | ICD-10-CM | POA: Diagnosis not present

## 2019-10-10 ENCOUNTER — Other Ambulatory Visit: Payer: Self-pay | Admitting: Family Medicine

## 2019-10-10 NOTE — Telephone Encounter (Signed)
Requested Prescriptions  Pending Prescriptions Disp Refills  . atorvastatin (LIPITOR) 20 MG tablet [Pharmacy Med Name: ATORVASTATIN 20 MG TABLET] 90 tablet 2    Sig: TAKE 1 TABLET BY MOUTH EVERY DAY     Cardiovascular:  Antilipid - Statins Failed - 10/10/2019  1:19 AM      Failed - Total Cholesterol in normal range and within 360 days    Cholesterol, Total  Date Value Ref Range Status  07/12/2019 250 (H) 100 - 199 mg/dL Final         Failed - LDL in normal range and within 360 days    LDL Chol Calc (NIH)  Date Value Ref Range Status  07/12/2019 171 (H) 0 - 99 mg/dL Final         Failed - Triglycerides in normal range and within 360 days    Triglycerides  Date Value Ref Range Status  07/12/2019 175 (H) 0 - 149 mg/dL Final         Passed - HDL in normal range and within 360 days    HDL  Date Value Ref Range Status  07/12/2019 47 >39 mg/dL Final         Passed - Patient is not pregnant      Passed - Valid encounter within last 12 months    Recent Outpatient Visits          1 month ago Chronic right-sided low back pain without sciatica   [place], [place], [place]   3 months ago Transient elevated blood pressure   [place], Lilia Argue, [place]      Future Appointments            In 1 week Orene Desanctis, [place], PA-C [facility], [place]

## 2019-10-19 ENCOUNTER — Other Ambulatory Visit: Payer: Self-pay

## 2019-10-19 ENCOUNTER — Ambulatory Visit (INDEPENDENT_AMBULATORY_CARE_PROVIDER_SITE_OTHER): Payer: BC Managed Care – PPO | Admitting: Family Medicine

## 2019-10-19 ENCOUNTER — Encounter: Payer: Self-pay | Admitting: Family Medicine

## 2019-10-19 VITALS — BP 134/90 | HR 84 | Temp 98.0°F | Wt 189.0 lb

## 2019-10-19 DIAGNOSIS — R7309 Other abnormal glucose: Secondary | ICD-10-CM | POA: Diagnosis not present

## 2019-10-19 DIAGNOSIS — Z6830 Body mass index (BMI) 30.0-30.9, adult: Secondary | ICD-10-CM | POA: Diagnosis not present

## 2019-10-19 DIAGNOSIS — E6609 Other obesity due to excess calories: Secondary | ICD-10-CM

## 2019-10-19 DIAGNOSIS — E785 Hyperlipidemia, unspecified: Secondary | ICD-10-CM | POA: Insufficient documentation

## 2019-10-19 DIAGNOSIS — E782 Mixed hyperlipidemia: Secondary | ICD-10-CM

## 2019-10-19 NOTE — Progress Notes (Signed)
BP 134/90   Pulse 84   Temp 98 F (36.7 C) (Oral)   Wt 189 lb (85.7 kg)   SpO2 97%   BMI 30.51 kg/m    Subjective:    Patient ID: Tiffany Boyer, female    DOB: 01-24-1966, 54 y.o.   MRN: [id number]  HPI: Tiffany Boyer is a 54 y.o. female  Chief Complaint  Patient presents with  . Hyperlipidemia  . Labs Only    pt would like to check her A1c   Here today for f/u hyperlipidemia, obesity and elevated glucose. Has changed diet significantly, cut out sodas and trying to cook more healthy options at home. Also exercising fairly regularly (as tolerated with her back pain). Has lost 20-30 lb since last visit. Tolerating lipitor very well, no side effects, claudication, myalgias, CP, SOB. No new concerns.   Relevant past medical, surgical, family and social history reviewed and updated as indicated. Interim medical history since our last visit reviewed. Allergies and medications reviewed and updated.  Review of Systems  Per HPI unless specifically indicated above     Objective:    BP 134/90   Pulse 84   Temp 98 F (36.7 C) (Oral)   Wt 189 lb (85.7 kg)   SpO2 97%   BMI 30.51 kg/m   Wt Readings from Last 3 Encounters:  10/19/19 189 lb (85.7 kg)  09/04/19 200 lb (90.7 kg)  08/16/19 208 lb 9.6 oz (94.6 kg)    Physical Exam Vitals and nursing note reviewed.  Constitutional:      Appearance: Normal appearance. She is not ill-appearing.  HENT:     Head: Atraumatic.  Eyes:     Extraocular Movements: Extraocular movements intact.     Conjunctiva/sclera: Conjunctivae normal.  Cardiovascular:     Rate and Rhythm: Normal rate and regular rhythm.     Heart sounds: Normal heart sounds.  Pulmonary:     Effort: Pulmonary effort is normal.     Breath sounds: Normal breath sounds.  Musculoskeletal:        General: Normal range of motion.     Cervical back: Normal range of motion and neck supple.  Skin:    General: Skin is warm and dry.  Neurological:     Mental Status:  She is alert and oriented to person, place, and time.  Psychiatric:        Mood and Affect: Mood normal.        Thought Content: Thought content normal.        Judgment: Judgment normal.     Results for orders placed or performed in visit on 10/19/19  Comprehensive metabolic panel  Result Value Ref Range   Glucose 98 65 - 99 mg/dL   BUN 9 6 - 24 mg/dL   Creatinine, Ser 0.73 0.57 - 1.00 mg/dL   GFR calc non Af Amer 93 >59 mL/min/1.73   GFR calc Af Amer 107 >59 mL/min/1.73   BUN/Creatinine Ratio 12 9 - 23   Sodium 144 134 - 144 mmol/L   Potassium 5.1 3.5 - 5.2 mmol/L   Chloride 107 (H) 96 - 106 mmol/L   CO2 24 20 - 29 mmol/L   Calcium 9.6 8.7 - 10.2 mg/dL   Total Protein 6.7 6.0 - 8.5 g/dL   Albumin 4.4 3.8 - 4.9 g/dL   Globulin, Total 2.3 1.5 - 4.5 g/dL   Albumin/Globulin Ratio 1.9 1.2 - 2.2   Bilirubin Total 0.6 0.0 - 1.2 mg/dL  Alkaline Phosphatase 111 48 - 121 IU/L   AST 28 0 - 40 IU/L   ALT 19 0 - 32 IU/L  Lipid Panel w/o Chol/HDL Ratio  Result Value Ref Range   Cholesterol, Total 196 100 - 199 mg/dL   Triglycerides 170 (H) 0 - 149 mg/dL   HDL 35 (L) >39 mg/dL   VLDL Cholesterol Cal 31 5 - 40 mg/dL   LDL Chol Calc (NIH) 130 (H) 0 - 99 mg/dL  HgB A1c  Result Value Ref Range   Hgb A1c MFr Bld 5.1 4.8 - 5.6 %   Est. average glucose Bld gHb Est-mCnc 100 mg/dL      Assessment & Plan:   Problem List Items Addressed This Visit      Other   Hyperlipidemia    Recheck lipids, adjust as needed. Continue excellent lifestyle changes for continued good control      Relevant Orders   Lipid Panel w/o Chol/HDL Ratio (Completed)   Obesity    Congratulated major changes to lifestyle and excellent weight loss success. Continue to monitor       Other Visit Diagnoses    Elevated glucose    -  Primary   Recheck labs, A1C. Treat if needed. Continue excellent lifestyle changes for control   Relevant Orders   Comprehensive metabolic panel (Completed)   HgB A1c (Completed)        Follow up plan: Return in about 6 months (around 04/19/2020) for 6 month f/u.

## 2019-10-20 LAB — COMPREHENSIVE METABOLIC PANEL
ALT: 19 IU/L (ref 0–32)
AST: 28 IU/L (ref 0–40)
Albumin/Globulin Ratio: 1.9 (ref 1.2–2.2)
Albumin: 4.4 g/dL (ref 3.8–4.9)
Alkaline Phosphatase: 111 IU/L (ref 48–121)
BUN/Creatinine Ratio: 12 (ref 9–23)
BUN: 9 mg/dL (ref 6–24)
Bilirubin Total: 0.6 mg/dL (ref 0.0–1.2)
CO2: 24 mmol/L (ref 20–29)
Calcium: 9.6 mg/dL (ref 8.7–10.2)
Chloride: 107 mmol/L — ABNORMAL HIGH (ref 96–106)
Creatinine, Ser: 0.73 mg/dL (ref 0.57–1.00)
GFR calc Af Amer: 107 mL/min/{1.73_m2} (ref >59)
GFR calc non Af Amer: 93 mL/min/{1.73_m2} (ref >59)
Globulin, Total: 2.3 g/dL (ref 1.5–4.5)
Glucose: 98 mg/dL (ref 65–99)
Potassium: 5.1 mmol/L (ref 3.5–5.2)
Sodium: 144 mmol/L (ref 134–144)
Total Protein: 6.7 g/dL (ref 6.0–8.5)

## 2019-10-20 LAB — LIPID PANEL W/O CHOL/HDL RATIO
Cholesterol, Total: 196 mg/dL (ref 100–199)
HDL: 35 mg/dL — ABNORMAL LOW (ref >39)
LDL Chol Calc (NIH): 130 mg/dL — ABNORMAL HIGH (ref 0–99)
Triglycerides: 170 mg/dL — ABNORMAL HIGH (ref 0–149)
VLDL Cholesterol Cal: 31 mg/dL (ref 5–40)

## 2019-10-20 LAB — HEMOGLOBIN A1C
Est. average glucose Bld gHb Est-mCnc: 100 mg/dL
Hgb A1c MFr Bld: 5.1 % (ref 4.8–5.6)

## 2019-10-24 DIAGNOSIS — Z6825 Body mass index (BMI) 25.0-25.9, adult: Secondary | ICD-10-CM | POA: Insufficient documentation

## 2019-10-24 NOTE — Assessment & Plan Note (Signed)
Congratulated major changes to lifestyle and excellent weight loss success. Continue to monitor

## 2019-10-24 NOTE — Assessment & Plan Note (Signed)
Recheck lipids, adjust as needed. Continue excellent lifestyle changes for continued good control

## 2019-12-24 ENCOUNTER — Encounter: Payer: Self-pay | Admitting: Family Medicine

## 2020-02-20 DIAGNOSIS — Z1231 Encounter for screening mammogram for malignant neoplasm of breast: Secondary | ICD-10-CM

## 2020-04-15 ENCOUNTER — Other Ambulatory Visit: Payer: Self-pay

## 2020-04-15 ENCOUNTER — Ambulatory Visit
Admission: RE | Admit: 2020-04-15 | Discharge: 2020-04-15 | Disposition: A | Discharge status: Home / Self Care | Payer: BC Managed Care – PPO | Source: Ambulatory Visit | Attending: Nurse Practitioner | Admitting: Nurse Practitioner

## 2020-04-15 DIAGNOSIS — Z1231 Encounter for screening mammogram for malignant neoplasm of breast: Secondary | ICD-10-CM | POA: Diagnosis not present

## 2020-04-15 DIAGNOSIS — N6312 Unspecified lump in the right breast, upper inner quadrant: Secondary | ICD-10-CM | POA: Diagnosis not present

## 2020-04-15 DIAGNOSIS — R928 Other abnormal and inconclusive findings on diagnostic imaging of breast: Secondary | ICD-10-CM | POA: Diagnosis not present

## 2020-04-20 ENCOUNTER — Encounter: Payer: Self-pay | Admitting: Nurse Practitioner

## 2020-04-20 DIAGNOSIS — R928 Other abnormal and inconclusive findings on diagnostic imaging of breast: Secondary | ICD-10-CM | POA: Insufficient documentation

## 2020-04-21 ENCOUNTER — Other Ambulatory Visit: Payer: Self-pay | Admitting: Nurse Practitioner

## 2020-04-21 ENCOUNTER — Ambulatory Visit
Admission: RE | Admit: 2020-04-21 | Discharge: 2020-04-21 | Disposition: A | Discharge status: Home / Self Care | Payer: BC Managed Care – PPO | Source: Ambulatory Visit | Attending: Nurse Practitioner | Admitting: Nurse Practitioner

## 2020-04-21 ENCOUNTER — Other Ambulatory Visit: Payer: Self-pay

## 2020-04-21 ENCOUNTER — Encounter: Payer: Self-pay | Admitting: Nurse Practitioner

## 2020-04-21 ENCOUNTER — Ambulatory Visit
Admission: RE | Admit: 2020-04-21 | Discharge: 2020-04-21 | Disposition: A | Discharge status: Home / Self Care | Payer: BC Managed Care – PPO | Attending: Nurse Practitioner | Admitting: Nurse Practitioner

## 2020-04-21 ENCOUNTER — Ambulatory Visit: Payer: BC Managed Care – PPO | Admitting: Family Medicine

## 2020-04-21 ENCOUNTER — Ambulatory Visit: Payer: BC Managed Care – PPO | Admitting: Nurse Practitioner

## 2020-04-21 VITALS — BP 125/71 | HR 64 | Temp 97.5°F | Wt 157.0 lb

## 2020-04-21 DIAGNOSIS — M4186 Other forms of scoliosis, lumbar region: Secondary | ICD-10-CM | POA: Insufficient documentation

## 2020-04-21 DIAGNOSIS — G8929 Other chronic pain: Secondary | ICD-10-CM | POA: Diagnosis not present

## 2020-04-21 DIAGNOSIS — M5441 Lumbago with sciatica, right side: Secondary | ICD-10-CM | POA: Diagnosis not present

## 2020-04-21 DIAGNOSIS — R7301 Impaired fasting glucose: Secondary | ICD-10-CM | POA: Diagnosis not present

## 2020-04-21 DIAGNOSIS — E782 Mixed hyperlipidemia: Secondary | ICD-10-CM

## 2020-04-21 DIAGNOSIS — M419 Scoliosis, unspecified: Secondary | ICD-10-CM

## 2020-04-21 DIAGNOSIS — M545 Low back pain, unspecified: Secondary | ICD-10-CM | POA: Diagnosis not present

## 2020-04-21 DIAGNOSIS — R2242 Localized swelling, mass and lump, left lower limb: Secondary | ICD-10-CM

## 2020-04-21 DIAGNOSIS — Z23 Encounter for immunization: Secondary | ICD-10-CM | POA: Diagnosis not present

## 2020-04-21 DIAGNOSIS — M47814 Spondylosis without myelopathy or radiculopathy, thoracic region: Secondary | ICD-10-CM | POA: Diagnosis not present

## 2020-04-21 MED ORDER — ATORVASTATIN CALCIUM 20 MG PO TABS
20.0000 mg | ORAL_TABLET | Freq: Every day | ORAL | 4 refills | Status: DC
Start: 2020-04-21 — End: 2021-05-06

## 2020-04-21 NOTE — Assessment & Plan Note (Signed)
Last below prediabetic range, recheck today and return in February for physical and to meet new PCP.

## 2020-04-21 NOTE — Assessment & Plan Note (Signed)
Chronic, ongoing.  Continue current medication regimen and adjust as needed.  Family history of stroke and MI in mother.  Refills sent in.  Lipid panel today.

## 2020-04-21 NOTE — Assessment & Plan Note (Signed)
Chronic, ongoing, intermittent for 3-4 years.  Will obtain imaging, suspect some scoliosis present and degenerative disc.  Recommend continued simple treatment at home with Tylenol and Aleeve as needed.  Apply cie and heat alternating as needed + try OTC Voltaren gel and Icy/Hot patches.  Recommend good stretching daily.  If ongoing could consider PT or ortho referral.

## 2020-04-21 NOTE — Assessment & Plan Note (Signed)
Praised for ongoing weight loss and healthy living.  Recommended eating smaller high protein, low fat meals more frequently and exercising 30 mins a day 5 times a week with a goal of 10-15lb weight loss in the next 3 months. Patient voiced their understanding and motivation to adhere to these recommendations.

## 2020-04-21 NOTE — Assessment & Plan Note (Signed)
Ongoing since teen years, on and off to pedal aspect left heel.  Suspect a clavus being present, will refer to podiatry for assessment.

## 2020-04-21 NOTE — Progress Notes (Signed)
Contacted via [place] afternoon Tiffany Boyer, your imaging has returned.  When we discuss back there are three sections we discuss -- cervical spine (neck and top of spine), thoracic spine (middle section) and lumbar spine (lower section).  I took had images done of the middle and lower sections.  Thoracic spine looks overall not bad, mild degenerative changes present.  Lumbar (lower) spine had scoliosis noted, which we discussed.  There is also multilevel degenerative changes present -- arthritis and loss of cushion.  This is most likely cause for your lower back issues.  I would recommend a visit with physical therapy, this may help with teaching stretches and strengthening exercises to help reduce chance of injury.  Would you like a referral to them?   There was also what we call an incidental finding where it is unsure whether it was stool in colon or a small kidney cyst (which can be common finding).  We will monitor this.  Any questions? Keep being awesome!!  Thank you for allowing me to participate in your care. Kindest regards, Marisol Giambra

## 2020-04-21 NOTE — Assessment & Plan Note (Signed)
Obtain imaging, refer to chronic back pain plan of care.  May benefit from PT in future.

## 2020-04-21 NOTE — Progress Notes (Signed)
Physical therapy order

## 2020-04-21 NOTE — Patient Instructions (Signed)

## 2020-04-21 NOTE — Progress Notes (Signed)
BP 125/71   Pulse 64   Temp (!) 97.5 F (36.4 C)   Wt 157 lb (71.2 kg)   SpO2 99%   BMI 25.34 kg/m    Subjective:    Patient ID: Tiffany Boyer, female    DOB: April 24, 1966, 54 y.o.   MRN: [id number]  HPI: Tiffany Boyer is a 54 y.o. female  Chief Complaint  Patient presents with  . Hyperlipidemia  . Obesity  . Hyperglycemia   HYPERLIPIDEMIA Last LDL in June was 130.  She continues on Atorvastatin 10 MG daily. Hyperlipidemia status: good compliance Satisfied with current treatment?  yes Side effects:  no Medication compliance: good compliance Supplements: none Aspirin:  no The 10-year ASCVD risk score Mikey Bussing DC Jr., et al., 2013) is: 7.2%   Values used to calculate the score:     Age: 80 years     Sex: Female     Is Non-Hispanic African American: No     Diabetic: No     Tobacco smoker: Yes     Systolic Blood Pressure: 017 mmHg     Is BP treated: No     HDL Cholesterol: 35 mg/dL     Total Cholesterol: 196 mg/dL Chest pain:  no Coronary artery disease:  no Family history CAD:  yes -- mother had stroke and MI at 63 Family history early CAD:  no   PREDIABETES Last A1C in June was 5.1%.  Has been working on weight loss and diet.  Has gone from 200 lbs to 157 lbs since April 2021.  Has cut back on sodas and drinking water.  Is walking more often.   Polydipsia/polyuria: no Visual disturbance: no Chest pain: no Paresthesias: no   BACK PAIN With her son he was OP during labor and had consistent back pain with this, ended up having c-section.  Has had back pain for 3-4 years, ended up in ER at that time with it.  Has chronic back pain issues -- sits a lot at job.    She has a place to her pedal aspect of heel to left foot, has had since she was a teenager and feels this sets her gait uneven.   Duration: chronic Mechanism of injury: unknown Location: R>L, bilateral and low back Onset: gradual Severity: 7/10 at worst Quality: dull and aching Frequency:  intermittent Radiation: R leg above the knee Aggravating factors: walking and prolonged sitting Alleviating factors: ice, heat, NSAIDs and APAP Status: stable Treatments attempted: ice, heat, APAP and aleve  Relief with NSAIDs?: moderate Nighttime pain:  no Paresthesias / decreased sensation:  no Bowel / bladder incontinence:  no Fevers:  no Dysuria / urinary frequency:  no  Relevant past medical, surgical, family and social history reviewed and updated as indicated. Interim medical history since our last visit reviewed. Allergies and medications reviewed and updated.  Review of Systems  Constitutional: Negative for activity change, appetite change, diaphoresis, fatigue and fever.  Respiratory: Negative for cough, chest tightness, shortness of breath and wheezing.   Cardiovascular: Negative for chest pain, palpitations and leg swelling.  Gastrointestinal: Negative.   Musculoskeletal: Positive for back pain.  Neurological: Negative.   Psychiatric/Behavioral: Negative.     Per HPI unless specifically indicated above     Objective:    BP 125/71   Pulse 64   Temp (!) 97.5 F (36.4 C)   Wt 157 lb (71.2 kg)   SpO2 99%   BMI 25.34 kg/m   Wt Readings from Last 3  Encounters:  04/21/20 157 lb (71.2 kg)  10/19/19 189 lb (85.7 kg)  09/04/19 200 lb (90.7 kg)    Physical Exam Vitals and nursing note reviewed.  Constitutional:      General: She is awake. She is not in acute distress.    Appearance: She is well-developed and well-groomed. She is not ill-appearing or toxic-appearing.  HENT:     Head: Normocephalic.     Right Ear: Hearing normal.     Left Ear: Hearing normal.  Eyes:     General: Lids are normal.        Right eye: No discharge.        Left eye: No discharge.     Conjunctiva/sclera: Conjunctivae normal.     Pupils: Pupils are equal, round, and reactive to light.  Neck:     Thyroid: No thyromegaly.     Vascular: No carotid bruit.  Cardiovascular:     Rate  and Rhythm: Normal rate and regular rhythm.     Heart sounds: Normal heart sounds. No murmur heard.  No gallop.   Pulmonary:     Effort: Pulmonary effort is normal. No accessory muscle usage or respiratory distress.     Breath sounds: Normal breath sounds.  Abdominal:     General: Bowel sounds are normal.     Palpations: Abdomen is soft. There is no hepatomegaly or splenomegaly.  Musculoskeletal:     Cervical back: Normal range of motion and neck supple.     Thoracic back: No swelling, edema, spasms or tenderness. Normal range of motion. Scoliosis present.     Lumbar back: Normal. No swelling, edema, spasms, tenderness or bony tenderness. Normal range of motion. Negative right straight leg raise test and negative left straight leg raise test.       Back:     Right lower leg: No edema.     Left lower leg: No edema.       Feet:  Skin:    General: Skin is warm and dry.     Findings: No rash.  Neurological:     Mental Status: She is alert and oriented to person, place, and time.     Deep Tendon Reflexes: Reflexes are normal and symmetric.     Reflex Scores:      Brachioradialis reflexes are 2+ on the right side and 2+ on the left side.      Patellar reflexes are 2+ on the right side and 2+ on the left side. Psychiatric:        Attention and Perception: Attention normal.        Mood and Affect: Mood normal.        Speech: Speech normal.        Behavior: Behavior normal. Behavior is cooperative.        Thought Content: Thought content normal.     Results for orders placed or performed in visit on 10/19/19  Comprehensive metabolic panel  Result Value Ref Range   Glucose 98 65 - 99 mg/dL   BUN 9 6 - 24 mg/dL   Creatinine, Ser 0.73 0.57 - 1.00 mg/dL   GFR calc non Af Amer 93 >59 mL/min/1.73   GFR calc Af Amer 107 >59 mL/min/1.73   BUN/Creatinine Ratio 12 9 - 23   Sodium 144 134 - 144 mmol/L   Potassium 5.1 3.5 - 5.2 mmol/L   Chloride 107 (H) 96 - 106 mmol/L   CO2 24 20 - 29  mmol/L   Calcium 9.6 8.7 -  10.2 mg/dL   Total Protein 6.7 6.0 - 8.5 g/dL   Albumin 4.4 3.8 - 4.9 g/dL   Globulin, Total 2.3 1.5 - 4.5 g/dL   Albumin/Globulin Ratio 1.9 1.2 - 2.2   Bilirubin Total 0.6 0.0 - 1.2 mg/dL   Alkaline Phosphatase 111 48 - 121 IU/L   AST 28 0 - 40 IU/L   ALT 19 0 - 32 IU/L  Lipid Panel w/o Chol/HDL Ratio  Result Value Ref Range   Cholesterol, Total 196 100 - 199 mg/dL   Triglycerides 170 (H) 0 - 149 mg/dL   HDL 35 (L) >39 mg/dL   VLDL Cholesterol Cal 31 5 - 40 mg/dL   LDL Chol Calc (NIH) 130 (H) 0 - 99 mg/dL  HgB A1c  Result Value Ref Range   Hgb A1c MFr Bld 5.1 4.8 - 5.6 %   Est. average glucose Bld gHb Est-mCnc 100 mg/dL      Assessment & Plan:   Problem List Items Addressed This Visit      Endocrine   IFG (impaired fasting glucose)    Last below prediabetic range, recheck today and return in February for physical and to meet new PCP.      Relevant Orders   HgB A1c     Nervous and Auditory   Chronic right-sided low back pain with right-sided sciatica    Chronic, ongoing, intermittent for 3-4 years.  Will obtain imaging, suspect some scoliosis present and degenerative disc.  Recommend continued simple treatment at home with Tylenol and Aleeve as needed.  Apply cie and heat alternating as needed + try OTC Voltaren gel and Icy/Hot patches.  Recommend good stretching daily.  If ongoing could consider PT or ortho referral.      Relevant Orders   DG Lumbar Spine Complete   DG Thoracic Spine W/Swimmers     Musculoskeletal and Integument   Scoliosis of thoracic spine    Obtain imaging, refer to chronic back pain plan of care.  May benefit from PT in future.      Relevant Orders   DG Thoracic Spine W/Swimmers     Other   Hyperlipidemia - Primary    Chronic, ongoing.  Continue current medication regimen and adjust as needed.  Family history of stroke and MI in mother.  Refills sent in.  Lipid panel today.         Relevant Medications    atorvastatin (LIPITOR) 20 MG tablet   Other Relevant Orders   Lipid Panel w/o Chol/HDL Ratio   Mass of left foot    Ongoing since teen years, on and off to pedal aspect left heel.  Suspect a clavus being present, will refer to podiatry for assessment.      Relevant Orders   Ambulatory referral to Podiatry    Other Visit Diagnoses    Need for influenza vaccination       Flu vaccine today   Relevant Orders   Flu Vaccine QUAD 6+ mos PF IM (Fluarix Quad PF) (Completed)       Follow up plan: Return in about 12 weeks (around 07/14/2020) for Annual physical due after 07/11/2020  --- meet NEW PCP.

## 2020-04-22 LAB — LIPID PANEL W/O CHOL/HDL RATIO
Cholesterol, Total: 189 mg/dL (ref 100–199)
HDL: 48 mg/dL (ref >39)
LDL Chol Calc (NIH): 122 mg/dL — ABNORMAL HIGH (ref 0–99)
Triglycerides: 106 mg/dL (ref 0–149)
VLDL Cholesterol Cal: 19 mg/dL (ref 5–40)

## 2020-04-22 LAB — HEMOGLOBIN A1C
Est. average glucose Bld gHb Est-mCnc: 97 mg/dL
Hgb A1c MFr Bld: 5 % (ref 4.8–5.6)

## 2020-04-22 NOTE — Progress Notes (Signed)
Contacted via MyChart The 10-year ASCVD risk score Mikey Bussing DC Jr., et al., 2013) is: 5.3%   Values used to calculate the score:     Age: 54 years     Sex: Female     Is Non-Hispanic African American: No     Diabetic: No     Tobacco smoker: Yes     Systolic Blood Pressure: 149 mmHg     Is BP treated: No     HDL Cholesterol: 48 mg/dL     Total Cholesterol: 189 mg/dL  Good evening Tiffany Boyer, your labs have returned.  Your A1C shows no diabetes or prediabetes, it is 5%.  Great job.  Your LDL is above normal, but lower then last check -- I would continue current Atorvastatin dose and if ongoing elevation consider increase at next visit. The LDL is the bad cholesterol. Over time and in combination with inflammation and other factors, this contributes to plaque which in turn may lead to stroke and/or heart attack down the road. Sometimes high LDL is primarily genetic, and people might be eating all the right foods but still have high numbers. Other times, there is room for improvement in one's diet and eating healthier can bring this number down and potentially reduce one's risk of heart attack and/or stroke.   To reduce your LDL, Remember - more fruits and vegetables, more fish, and limit red meat and dairy products. More soy, nuts, beans, barley, lentils, oats and plant sterol ester enriched margarine instead of butter. I also encourage eliminating sugar and processed food. Remember, shop on the outside of the grocery store and visit your [employer]. If you would like to talk with me about dietary changes for your cholesterol, please let me know. We should recheck your cholesterol in 6 months.  Any questions? Keep being awesome!!  Thank you for allowing me to participate in your care. Kindest regards, Katalena Malveaux

## 2020-04-23 ENCOUNTER — Ambulatory Visit: Payer: BC Managed Care – PPO | Admitting: Podiatry

## 2020-04-23 ENCOUNTER — Other Ambulatory Visit: Payer: Self-pay

## 2020-04-23 ENCOUNTER — Encounter: Payer: Self-pay | Admitting: Podiatry

## 2020-04-23 DIAGNOSIS — Q828 Other specified congenital malformations of skin: Secondary | ICD-10-CM | POA: Diagnosis not present

## 2020-04-23 NOTE — Progress Notes (Signed)
  Subjective:  Patient ID: Tiffany Boyer, female    DOB: 18-Apr-1966,  MRN: [id number] HPI Chief Complaint  Patient presents with  . Foot Pain    Plantar heel left - callused area x many years, since teenager, tried scraping  . Toe Pain    2nd toe right - toenail is dark, "looks bruised", on weightloss program and has been walking a lot  . New Patient (Initial Visit)    54 y.o. female presents with the above complaint.   ROS: Denies fever chills nausea vomiting muscle aches pains calf pain back pain chest pain shortness of breath.  Past Medical History:  Diagnosis Date  . Hyperlipidemia    Past Surgical History:  Procedure Laterality Date  . CESAREAN SECTION    . COLONOSCOPY WITH PROPOFOL N/A 09/04/2019   Procedure: COLONOSCOPY WITH PROPOFOL;  Surgeon: Jonathon Bellows, MD;  Location: [facility] ENDOSCOPY;  Service: Gastroenterology;  Laterality: N/A;  . TONSILLECTOMY      Current Outpatient Medications:  .  atorvastatin (LIPITOR) 20 MG tablet, Take 1 tablet (20 mg total) by mouth daily., Disp: 90 tablet, Rfl: 4 .  Multiple Vitamins-Minerals (WOMENS 50+ MULTI VITAMIN/MIN PO), Take by mouth daily., Disp: , Rfl:  .  Turmeric (QC TUMERIC COMPLEX PO), Take by mouth., Disp: , Rfl:   No Known Allergies Review of Systems Objective:  There were no vitals filed for this visit.  General: Well developed, nourished, in no acute distress, alert and oriented x3   Dermatological: Skin is warm, dry and supple bilateral. Nails x 10 are well maintained; remaining integument appears unremarkable at this time. There are no open sores, no preulcerative lesions, no rash or signs of infection present.  Nail dystrophy to the second digits bilaterally associated with hammertoe deformity and chronic microtrauma to the distal aspect of the second toe.  Reactive hyperkeratotic lesion plantar medial aspect of the left heel most likely secondary to a foreign object when she was much younger.  Vascular: Dorsalis  Pedis artery and Posterior Tibial artery pedal pulses are 2/4 bilateral with immedate capillary fill time. Pedal hair growth present. No varicosities and no lower extremity edema present bilateral.   Neruologic: Grossly intact via light touch bilateral. Vibratory intact via tuning fork bilateral. Protective threshold with Semmes Wienstein monofilament intact to all pedal sites bilateral. Patellar and Achilles deep tendon reflexes 2+ bilateral. No Babinski or clonus noted bilateral.   Musculoskeletal: No gross boney pedal deformities bilateral. No pain, crepitus, or limitation noted with foot and ankle range of motion bilateral. Muscular strength 5/5 in all groups tested bilateral.  Flexible hammertoe deformities bilateral second.  Gait: Unassisted, Nonantalgic.    Radiographs:  None taken  Assessment & Plan:   Assessment: Nail dystrophy second digits bilateral with hammertoe deformities.  Benign soft tissue lesion skin left foot  Plan: Debrided reactive hyperkeratotic tissue discussed appropriate shoe gear.     Dashana Guizar T. [place], [place]

## 2020-05-27 ENCOUNTER — Ambulatory Visit: Payer: BC Managed Care – PPO

## 2020-06-04 ENCOUNTER — Other Ambulatory Visit: Payer: Self-pay

## 2020-06-04 ENCOUNTER — Ambulatory Visit: Payer: BC Managed Care – PPO

## 2020-06-09 ENCOUNTER — Ambulatory Visit: Payer: BC Managed Care – PPO

## 2020-08-03 IMAGING — MG MM DIGITAL DIAGNOSTIC UNILAT*R* W/ TOMO W/ CAD
4 series · 4 of 12 positions shown · non-contrast
Comparison: Previous exam(s).

ACR Breast Density Category a: The breast tissue is almost entirely
fatty.

CLINICAL DATA: Patient recalled from screening for right breast
asymmetry.

EXAM:
DIGITAL DIAGNOSTIC RIGHT MAMMOGRAM WITH CAD AND TOMO
ULTRASOUND RIGHT BREAST

[R ML synth-2D]
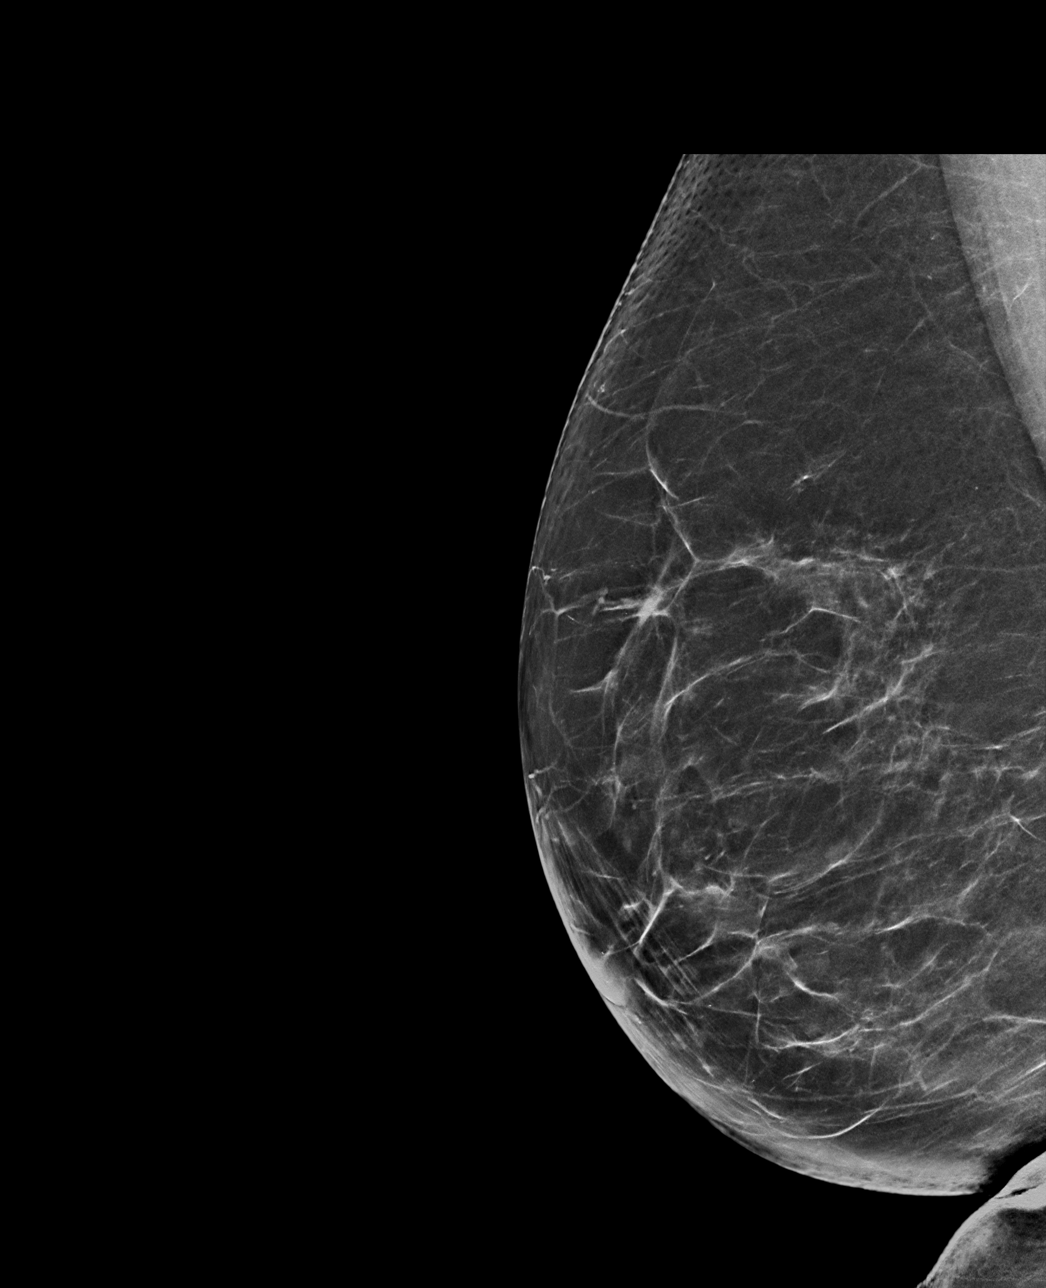

[R CC synth-2D]
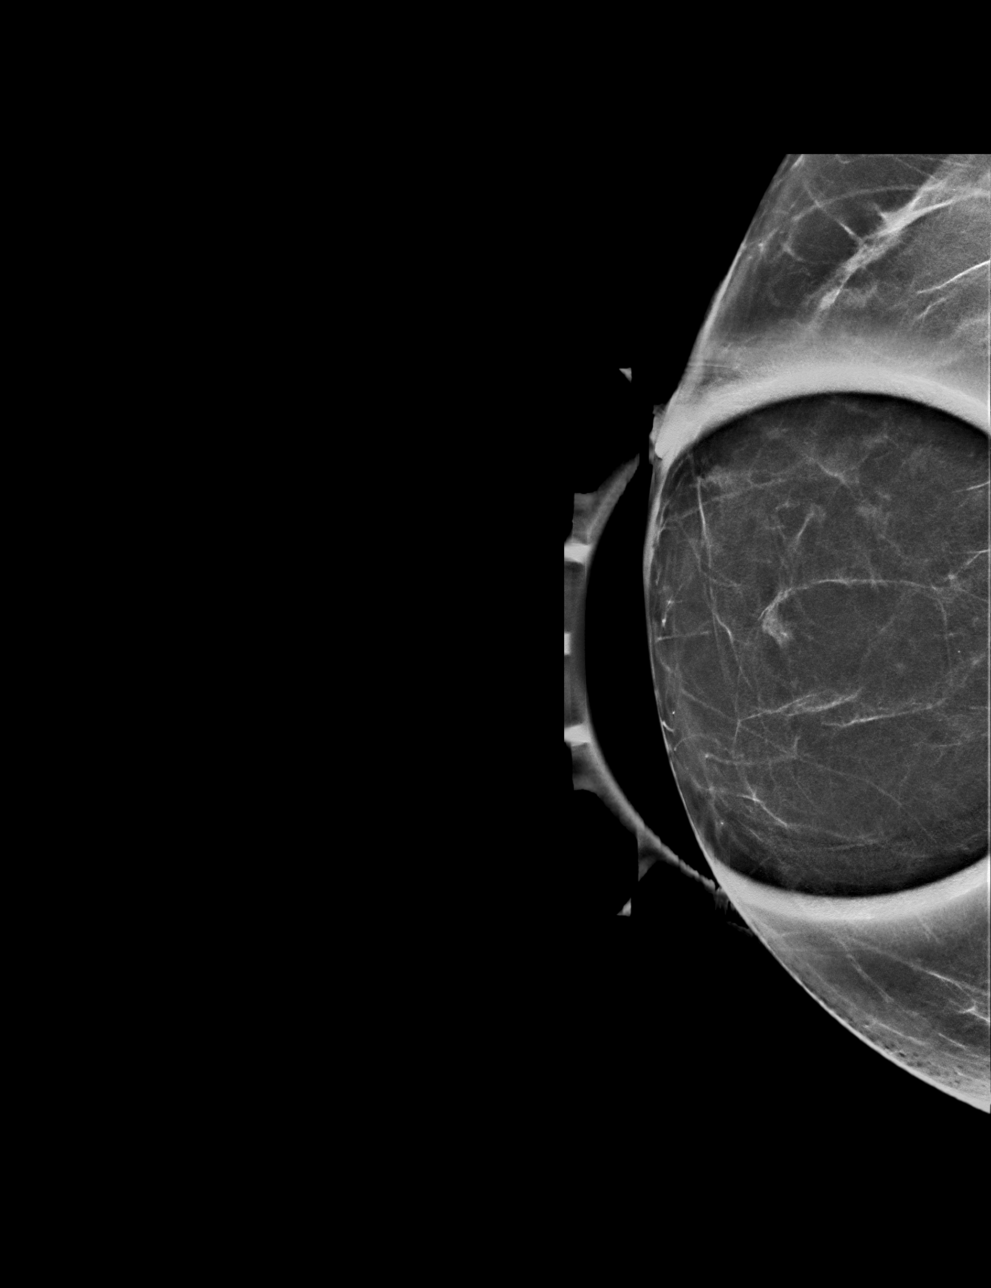

[R CC tomo · tomo slice 29/57.0]
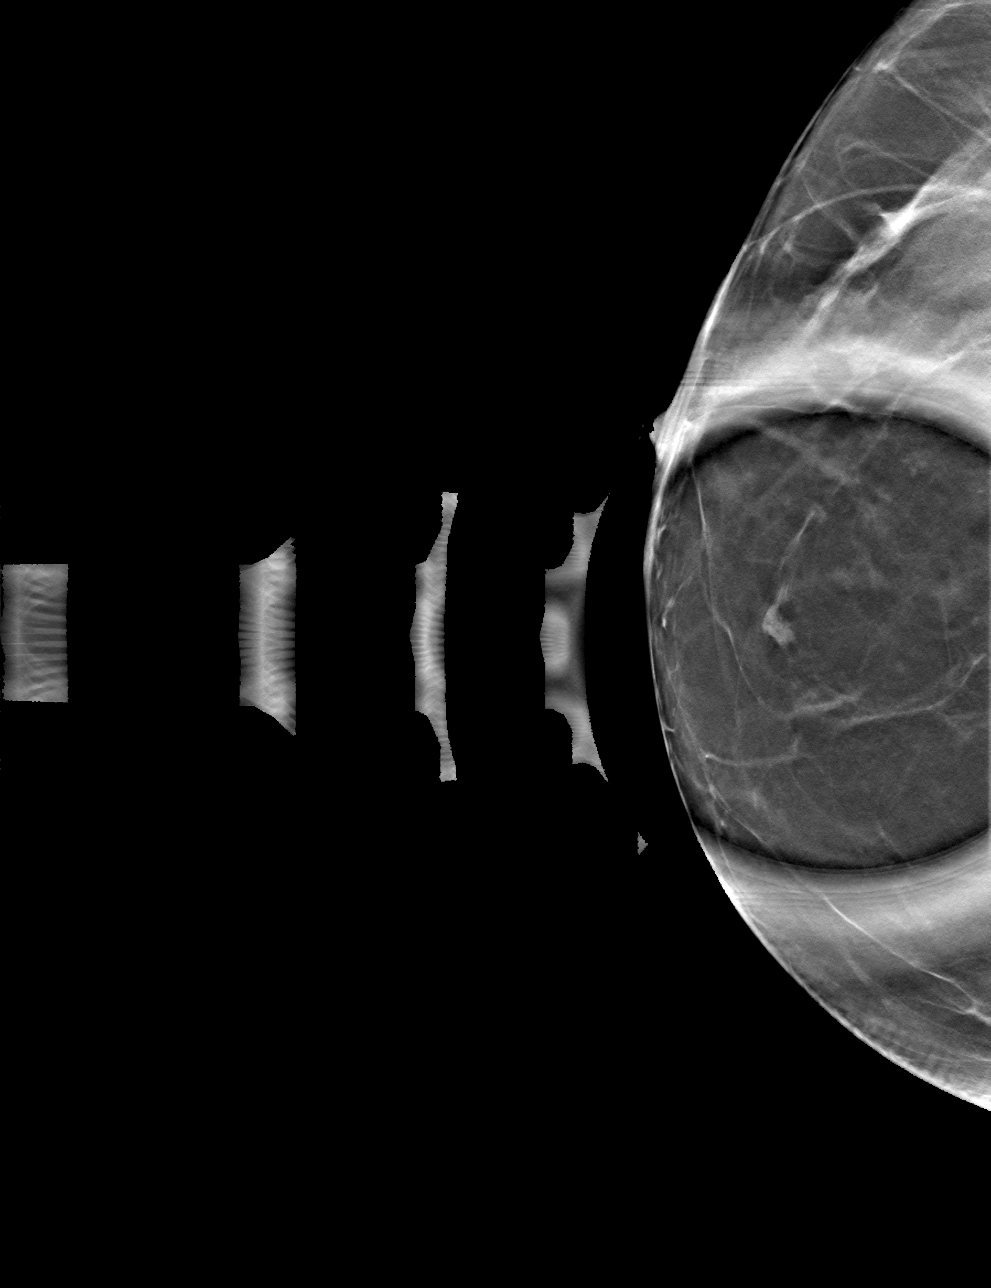

[R ML tomo · tomo slice 38/75.0]
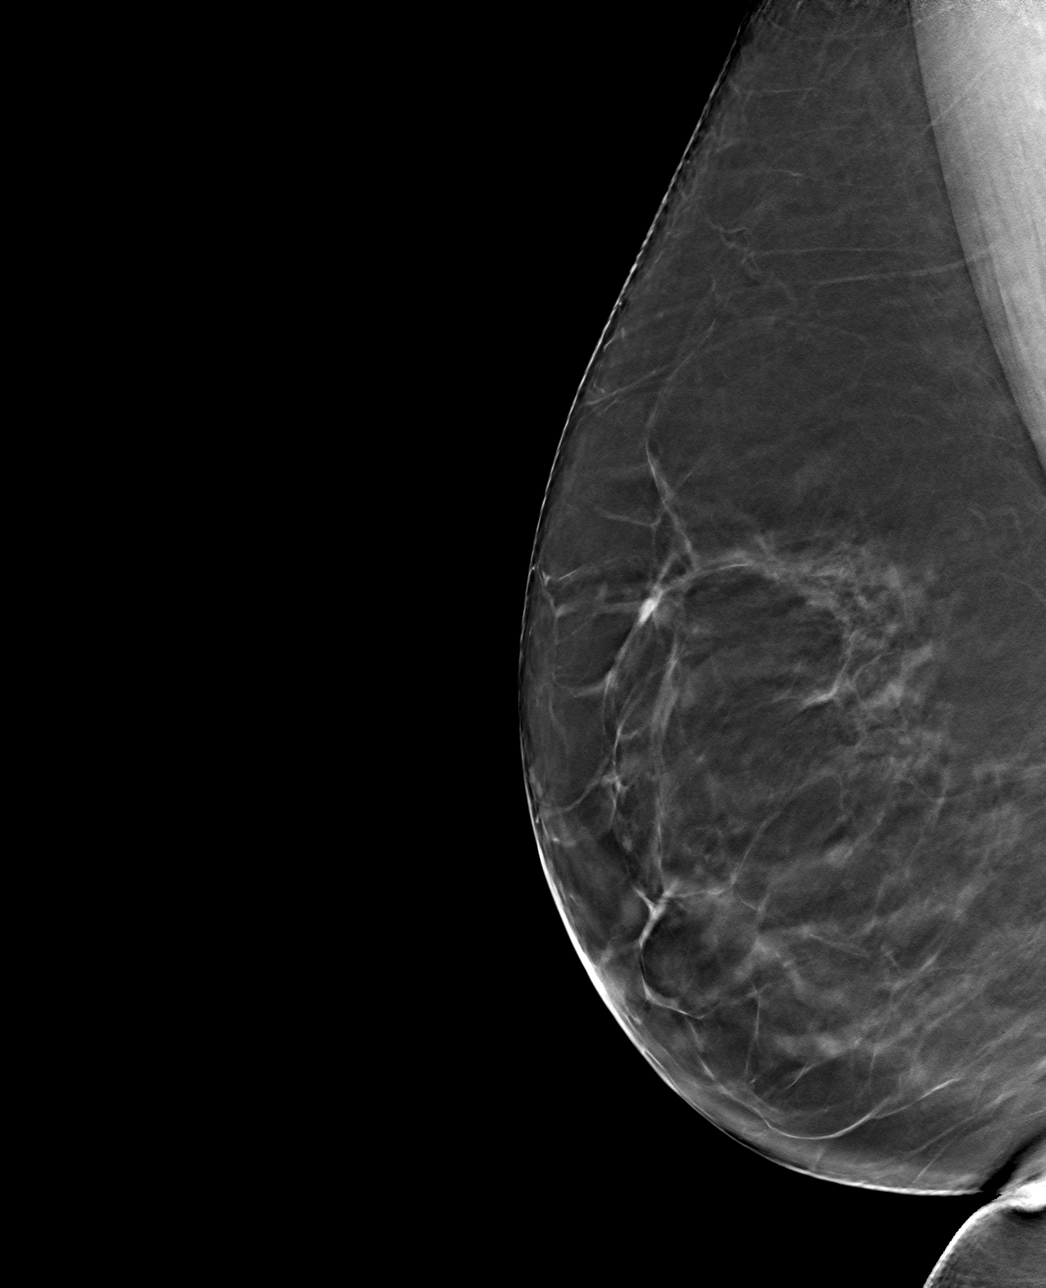

[4 of 12 positions shown; findings below may reference images not displayed]

FINDINGS: There is a persistent circumscribed oval mass within the medial
periareolar right breast demonstrated on spot compression cc
tomosynthesis images.

Mammographic images were processed with CAD.

On physical exam, no discrete mass is palpated within the medial
periareolar right breast.

Targeted ultrasound is performed, showing a 7 x 6 x 4 mm cluster of
cysts right breast 2:30 o'clock 3 cm from nipple.
IMPRESSION: Probably benign right breast mass favored to represent a cluster of
cysts.

RECOMMENDATION:
Right breast diagnostic mammogram and ultrasound in 6 months to
reassess probably benign right breast mass.

I have discussed the findings and recommendations with the patient.
If applicable, a reminder letter will be sent to the patient
regarding the next appointment.

BI-RADS CATEGORY  3: Probably benign.

## 2020-09-23 ENCOUNTER — Other Ambulatory Visit: Payer: Self-pay

## 2020-09-23 DIAGNOSIS — N6310 Unspecified lump in the right breast, unspecified quadrant: Secondary | ICD-10-CM

## 2020-09-30 ENCOUNTER — Other Ambulatory Visit: Payer: Self-pay

## 2020-09-30 DIAGNOSIS — N6310 Unspecified lump in the right breast, unspecified quadrant: Secondary | ICD-10-CM

## 2020-10-01 DIAGNOSIS — Z859 Personal history of malignant neoplasm, unspecified: Secondary | ICD-10-CM | POA: Diagnosis not present

## 2020-10-01 DIAGNOSIS — L578 Other skin changes due to chronic exposure to nonionizing radiation: Secondary | ICD-10-CM | POA: Diagnosis not present

## 2020-10-01 DIAGNOSIS — Z872 Personal history of diseases of the skin and subcutaneous tissue: Secondary | ICD-10-CM | POA: Diagnosis not present

## 2020-10-01 DIAGNOSIS — L570 Actinic keratosis: Secondary | ICD-10-CM | POA: Diagnosis not present

## 2020-10-16 ENCOUNTER — Other Ambulatory Visit: Payer: Self-pay

## 2020-10-16 ENCOUNTER — Ambulatory Visit
Admission: RE | Admit: 2020-10-16 | Discharge: 2020-10-16 | Disposition: A | Discharge status: Home / Self Care | Payer: BC Managed Care – PPO | Source: Ambulatory Visit | Attending: Nurse Practitioner | Admitting: Nurse Practitioner

## 2020-10-16 DIAGNOSIS — N6310 Unspecified lump in the right breast, unspecified quadrant: Secondary | ICD-10-CM | POA: Insufficient documentation

## 2020-10-16 DIAGNOSIS — R928 Other abnormal and inconclusive findings on diagnostic imaging of breast: Secondary | ICD-10-CM | POA: Diagnosis not present

## 2020-10-16 DIAGNOSIS — N6001 Solitary cyst of right breast: Secondary | ICD-10-CM | POA: Diagnosis not present

## 2020-10-16 NOTE — Progress Notes (Signed)
Please let patient know her Mammogram did not show any evidence of a malignancy.  The recommendation is to repeat the Mammogram in 1 year.  

## 2020-11-04 ENCOUNTER — Encounter: Payer: Self-pay | Admitting: Nurse Practitioner

## 2020-11-04 ENCOUNTER — Other Ambulatory Visit: Payer: Self-pay

## 2020-11-04 ENCOUNTER — Ambulatory Visit (INDEPENDENT_AMBULATORY_CARE_PROVIDER_SITE_OTHER): Payer: BC Managed Care – PPO | Admitting: Nurse Practitioner

## 2020-11-04 VITALS — BP 130/72 | HR 62 | Temp 97.8°F | Ht 63.62 in | Wt 159.4 lb

## 2020-11-04 DIAGNOSIS — Z0000 Encounter for general adult medical examination without abnormal findings: Secondary | ICD-10-CM

## 2020-11-04 DIAGNOSIS — Z23 Encounter for immunization: Secondary | ICD-10-CM | POA: Diagnosis not present

## 2020-11-04 DIAGNOSIS — E782 Mixed hyperlipidemia: Secondary | ICD-10-CM

## 2020-11-04 DIAGNOSIS — R7301 Impaired fasting glucose: Secondary | ICD-10-CM

## 2020-11-04 LAB — URINALYSIS, ROUTINE W REFLEX MICROSCOPIC
Bilirubin, UA: NEGATIVE
Glucose, UA: NEGATIVE
Ketones, UA: NEGATIVE
Leukocytes,UA: NEGATIVE
Nitrite, UA: NEGATIVE
Protein,UA: NEGATIVE
Specific Gravity, UA: 1.015 (ref 1.005–1.030)
Urobilinogen, Ur: 1 mg/dL (ref 0.2–1.0)
pH, UA: 7 (ref 5.0–7.5)

## 2020-11-04 LAB — MICROSCOPIC EXAMINATION
Bacteria, UA: NONE SEEN
WBC, UA: NONE SEEN /hpf (ref 0–5)

## 2020-11-04 NOTE — Assessment & Plan Note (Signed)
Controlled. Last below prediabetic range.  Will recheck at next visit.

## 2020-11-04 NOTE — Assessment & Plan Note (Signed)
Chronic, ongoing.  Continue current medication regimen and adjust as needed.  Family history of stroke and MI in mother. Lipid panel today.

## 2020-11-04 NOTE — Progress Notes (Signed)
BP 130/72   Pulse 62   Temp 97.8 F (36.6 C)   Ht 5' 3.62" (1.616 m)   Wt 159 lb 6 oz (72.3 kg)   SpO2 99%   BMI 27.68 kg/m    Subjective:    Patient ID: Tiffany Boyer, female    DOB: 1966-04-26, 55 y.o.   MRN: [id number]  HPI: Tiffany Boyer is a 55 y.o. female presenting on 11/04/2020 for comprehensive medical examination. Current medical complaints include:none  She currently lives with: Husband, daughter Menopausal Symptoms: no  HYPERLIPIDEMIA Hyperlipidemia status: excellent compliance Satisfied with current treatment?  Yes Side effects:  no Medication compliance: excellent compliance Past cholesterol meds: atorvastain (lipitor) Supplements: none Aspirin:  no The 10-year ASCVD risk score Mikey Bussing DC Jr., et al., 2013) is: 5.7%   Values used to calculate the score:     Age: 67 years     Sex: Female     Is Non-Hispanic African American: No     Diabetic: No     Tobacco smoker: Yes     Systolic Blood Pressure: 242 mmHg     Is BP treated: No     HDL Cholesterol: 48 mg/dL     Total Cholesterol: 189 mg/dL Chest pain:  no Coronary artery disease:  no Family history CAD:  no Family history early CAD:  no  Patient does check blood pressure at home and it runs 130/70s.  Depression Screen done today and results listed below:  Depression screen [facility] 2/9 11/04/2020 07/12/2019  Decreased Interest 0 2  Down, Depressed, Hopeless 0 0  PHQ - 2 Score 0 2  Altered sleeping - 3  Tired, decreased energy - 0  Change in appetite - 1  Feeling bad or failure about yourself  - 0  Trouble concentrating - 0  Moving slowly or fidgety/restless - 0  Suicidal thoughts - 0  PHQ-9 Score - 6    The patient does not have a history of falls. I did complete a risk assessment for falls. A plan of care for falls was documented.   Past Medical History:  Past Medical History:  Diagnosis Date   Hyperlipidemia     Surgical History:  Past Surgical History:  Procedure Laterality Date    CESAREAN SECTION     COLONOSCOPY WITH PROPOFOL N/A 09/04/2019   Procedure: COLONOSCOPY WITH PROPOFOL;  Surgeon: Jonathon Bellows, MD;  Location: [facility] ENDOSCOPY;  Service: Gastroenterology;  Laterality: N/A;   TONSILLECTOMY      Medications:  Current Outpatient Medications on File Prior to Visit  Medication Sig   atorvastatin (LIPITOR) 20 MG tablet Take 1 tablet (20 mg total) by mouth daily.   Multiple Vitamins-Minerals (WOMENS 50+ MULTI VITAMIN/MIN PO) Take by mouth daily.   Turmeric (QC TUMERIC COMPLEX PO) Take by mouth.   No current facility-administered medications on file prior to visit.    Allergies:  No Known Allergies  Social History:  Social History   Socioeconomic History   Marital status: Married    Spouse name: Not on file   Number of children: Not on file   Years of education: Not on file   Highest education level: Not on file  Occupational History   Not on file  Tobacco Use   Smoking status: Every Day    Packs/day: 0.50    Pack years: 0.00    Types: Cigarettes   Smokeless tobacco: Never  Vaping Use   Vaping Use: Never used  Substance and Sexual Activity  Alcohol use: No   Drug use: No   Sexual activity: Yes  Other Topics Concern   Not on file  Social History Narrative   Not on file   Social Determinants of Health   Financial Resource Strain: Not on file  Food Insecurity: Not on file  Transportation Needs: Not on file  Physical Activity: Not on file  Stress: Not on file  Social Connections: Not on file  Intimate Partner Violence: Not on file   Social History   Tobacco Use  Smoking Status Every Day   Packs/day: 0.50   Pack years: 0.00   Types: Cigarettes  Smokeless Tobacco Never   Social History   Substance and Sexual Activity  Alcohol Use No    Family History:  Family History  Problem Relation Age of Onset   Hypertension Mother    Stroke Brother    Breast cancer Paternal Aunt     Past medical history, surgical history,  medications, allergies, family history and social history reviewed with patient today and changes made to appropriate areas of the chart.   Review of Systems  Eyes:  Negative for blurred vision and double vision.  Respiratory:  Negative for shortness of breath.   Cardiovascular:  Negative for chest pain, palpitations and leg swelling.  Neurological:  Negative for dizziness and headaches.  All other ROS negative except what is listed above and in the HPI.      Objective:    BP 130/72   Pulse 62   Temp 97.8 F (36.6 C)   Ht 5' 3.62" (1.616 m)   Wt 159 lb 6 oz (72.3 kg)   SpO2 99%   BMI 27.68 kg/m   Wt Readings from Last 3 Encounters:  11/04/20 159 lb 6 oz (72.3 kg)  04/21/20 157 lb (71.2 kg)  10/19/19 189 lb (85.7 kg)    Physical Exam Vitals and nursing note reviewed.  Constitutional:      General: She is awake. She is not in acute distress.    Appearance: She is well-developed. She is not ill-appearing.  HENT:     Head: Normocephalic and atraumatic.     Right Ear: Hearing, tympanic membrane, ear canal and external ear normal. No drainage.     Left Ear: Hearing, tympanic membrane, ear canal and external ear normal. No drainage.     Nose: Nose normal.     Right Sinus: No maxillary sinus tenderness or frontal sinus tenderness.     Left Sinus: No maxillary sinus tenderness or frontal sinus tenderness.     Mouth/Throat:     Mouth: Mucous membranes are moist.     Pharynx: Oropharynx is clear. Uvula midline. No pharyngeal swelling, oropharyngeal exudate or posterior oropharyngeal erythema.  Eyes:     General: Lids are normal.        Right eye: No discharge.        Left eye: No discharge.     Extraocular Movements: Extraocular movements intact.     Conjunctiva/sclera: Conjunctivae normal.     Pupils: Pupils are equal, round, and reactive to light.     Visual Fields: Right eye visual fields normal and left eye visual fields normal.  Neck:     Thyroid: No thyromegaly.      Vascular: No carotid bruit.     Trachea: Trachea normal.  Cardiovascular:     Rate and Rhythm: Normal rate and regular rhythm.     Heart sounds: Normal heart sounds. No murmur heard.   No gallop.  Pulmonary:     Effort: Pulmonary effort is normal. No accessory muscle usage or respiratory distress.     Breath sounds: Normal breath sounds.  Chest:  Breasts:    Right: Normal. No axillary adenopathy or supraclavicular adenopathy.     Left: Normal. No axillary adenopathy or supraclavicular adenopathy.  Abdominal:     General: Bowel sounds are normal.     Palpations: Abdomen is soft. There is no hepatomegaly or splenomegaly.     Tenderness: There is no abdominal tenderness.  Musculoskeletal:        General: Normal range of motion.     Cervical back: Normal range of motion and neck supple.     Right lower leg: No edema.     Left lower leg: No edema.  Lymphadenopathy:     Head:     Right side of head: No submental, submandibular, tonsillar, preauricular or posterior auricular adenopathy.     Left side of head: No submental, submandibular, tonsillar, preauricular or posterior auricular adenopathy.     Cervical: No cervical adenopathy.     Upper Body:     Right upper body: No supraclavicular, axillary or pectoral adenopathy.     Left upper body: No supraclavicular, axillary or pectoral adenopathy.  Skin:    General: Skin is warm and dry.     Capillary Refill: Capillary refill takes less than 2 seconds.     Findings: No rash.  Neurological:     Mental Status: She is alert and oriented to person, place, and time.     Cranial Nerves: Cranial nerves are intact.     Gait: Gait is intact.     Deep Tendon Reflexes: Reflexes are normal and symmetric.     Reflex Scores:      Brachioradialis reflexes are 2+ on the right side and 2+ on the left side.      Patellar reflexes are 2+ on the right side and 2+ on the left side. Psychiatric:        Attention and Perception: Attention normal.         Mood and Affect: Mood normal.        Speech: Speech normal.        Behavior: Behavior normal. Behavior is cooperative.        Thought Content: Thought content normal.        Judgment: Judgment normal.    Results for orders placed or performed in visit on 04/21/20  Lipid Panel w/o Chol/HDL Ratio  Result Value Ref Range   Cholesterol, Total 189 100 - 199 mg/dL   Triglycerides 106 0 - 149 mg/dL   HDL 48 >39 mg/dL   VLDL Cholesterol Cal 19 5 - 40 mg/dL   LDL Chol Calc (NIH) 122 (H) 0 - 99 mg/dL  HgB A1c  Result Value Ref Range   Hgb A1c MFr Bld 5.0 4.8 - 5.6 %   Est. average glucose Bld gHb Est-mCnc 97 mg/dL      Assessment & Plan:   Problem List Items Addressed This Visit       Endocrine   IFG (impaired fasting glucose)    Controlled. Last below prediabetic range.  Will recheck at next visit.          Other   Hyperlipidemia    Chronic, ongoing.  Continue current medication regimen and adjust as needed.  Family history of stroke and MI in mother. Lipid panel today.          Other Visit Diagnoses  Annual physical exam    -  Primary   Health Maintenance reviewed. Labs ordered today. Pneumonia Vaccine given. Patient will return for shingles vaccine.   Relevant Orders   CBC with Differential/Platelet   Comprehensive metabolic panel   Lipid panel   TSH   Urinalysis, Routine w reflex microscopic   Immunization due       Relevant Orders   Pneumococcal polysaccharide vaccine 23-valent greater than or equal to 2yo subcutaneous/IM (Completed)        Follow up plan: Return in about 6 months (around 05/06/2021) for HTN, HLD, DM2 FU.   LABORATORY TESTING:  - Pap smear: up to date  IMMUNIZATIONS:   - Tdap: Tetanus vaccination status reviewed: last tetanus booster within 10 years. - Influenza: Postponed to flu season - Pneumovax: Administered today - Prevnar: Not applicable - HPV: Not applicable - Zostavax vaccine:  discussed today. Will coe back for  vaccine  SCREENING: -Mammogram: Up to date  - Colonoscopy: Up to date  - Bone Density: Not applicable  -Hearing Test: Not applicable  -Spirometry: Not applicable   PATIENT COUNSELING:   Advised to take 1 mg of folate supplement per day if capable of pregnancy.   Sexuality: Discussed sexually transmitted diseases, partner selection, use of condoms, avoidance of unintended pregnancy  and contraceptive alternatives.   Advised to avoid cigarette smoking.  I discussed with the patient that most people either abstain from alcohol or drink within safe limits (<=14/week and <=4 drinks/occasion for males, <=7/weeks and <= 3 drinks/occasion for females) and that the risk for alcohol disorders and other health effects rises proportionally with the number of drinks per week and how often a drinker exceeds daily limits.  Discussed cessation/primary prevention of drug use and availability of treatment for abuse.   Diet: Encouraged to adjust caloric intake to maintain  or achieve ideal body weight, to reduce intake of dietary saturated fat and total fat, to limit sodium intake by avoiding high sodium foods and not adding table salt, and to maintain adequate dietary potassium and calcium preferably from fresh fruits, vegetables, and low-fat dairy products.    stressed the importance of regular exercise  Injury prevention: Discussed safety belts, safety helmets, smoke detector, smoking near bedding or upholstery.   Dental health: Discussed importance of regular tooth brushing, flossing, and dental visits.    NEXT PREVENTATIVE PHYSICAL DUE IN 1 YEAR. Return in about 6 months (around 05/06/2021) for HTN, HLD, DM2 FU.

## 2020-11-05 LAB — CBC WITH DIFFERENTIAL/PLATELET
Basophils Absolute: 0 10*3/uL (ref 0.0–0.2)
Basos: 1 %
EOS (ABSOLUTE): 0.1 10*3/uL (ref 0.0–0.4)
Eos: 2 %
Hematocrit: 40.8 % (ref 34.0–46.6)
Hemoglobin: 13.8 g/dL (ref 11.1–15.9)
Immature Grans (Abs): 0 10*3/uL (ref 0.0–0.1)
Immature Granulocytes: 0 %
Lymphocytes Absolute: 1.9 10*3/uL (ref 0.7–3.1)
Lymphs: 34 %
MCH: 31.3 pg (ref 26.6–33.0)
MCHC: 33.8 g/dL (ref 31.5–35.7)
MCV: 93 fL (ref 79–97)
Monocytes Absolute: 0.3 10*3/uL (ref 0.1–0.9)
Monocytes: 5 %
Neutrophils Absolute: 3.3 10*3/uL (ref 1.4–7.0)
Neutrophils: 58 %
Platelets: 147 10*3/uL — ABNORMAL LOW (ref 150–450)
RBC: 4.41 x10E6/uL (ref 3.77–5.28)
RDW: 13.1 % (ref 11.7–15.4)
WBC: 5.5 10*3/uL (ref 3.4–10.8)

## 2020-11-05 LAB — COMPREHENSIVE METABOLIC PANEL
ALT: 22 IU/L (ref 0–32)
AST: 35 IU/L (ref 0–40)
Albumin/Globulin Ratio: 2.3 — ABNORMAL HIGH (ref 1.2–2.2)
Albumin: 4.6 g/dL (ref 3.8–4.9)
Alkaline Phosphatase: 111 IU/L (ref 44–121)
BUN/Creatinine Ratio: 18 (ref 9–23)
BUN: 11 mg/dL (ref 6–24)
Bilirubin Total: 0.4 mg/dL (ref 0.0–1.2)
CO2: 21 mmol/L (ref 20–29)
Calcium: 9.4 mg/dL (ref 8.7–10.2)
Chloride: 104 mmol/L (ref 96–106)
Creatinine, Ser: 0.62 mg/dL (ref 0.57–1.00)
Globulin, Total: 2 g/dL (ref 1.5–4.5)
Glucose: 89 mg/dL (ref 65–99)
Potassium: 4.2 mmol/L (ref 3.5–5.2)
Sodium: 142 mmol/L (ref 134–144)
Total Protein: 6.6 g/dL (ref 6.0–8.5)
eGFR: 104 mL/min/{1.73_m2} (ref >59)

## 2020-11-05 LAB — LIPID PANEL
Chol/HDL Ratio: 3.7 ratio (ref 0.0–4.4)
Cholesterol, Total: 190 mg/dL (ref 100–199)
HDL: 52 mg/dL (ref >39)
LDL Chol Calc (NIH): 118 mg/dL — ABNORMAL HIGH (ref 0–99)
Triglycerides: 112 mg/dL (ref 0–149)
VLDL Cholesterol Cal: 20 mg/dL (ref 5–40)

## 2020-11-05 LAB — TSH: TSH: 1.29 u[IU]/mL (ref 0.450–4.500)

## 2020-11-06 NOTE — Progress Notes (Signed)
Hi Marcie Bal. It was a pleasure seeing you this week. Your lab work looks good. Your cholesterol is slightly elevated. Recommend a low fat diet and exercise. Otherwise we will follow up as discussed in our visit. Please let me know if you have any questions.

## 2020-11-14 ENCOUNTER — Ambulatory Visit (INDEPENDENT_AMBULATORY_CARE_PROVIDER_SITE_OTHER): Payer: BC Managed Care – PPO

## 2020-11-14 ENCOUNTER — Other Ambulatory Visit: Payer: Self-pay

## 2020-11-14 DIAGNOSIS — Z23 Encounter for immunization: Secondary | ICD-10-CM

## 2020-12-08 DIAGNOSIS — D485 Neoplasm of uncertain behavior of skin: Secondary | ICD-10-CM | POA: Diagnosis not present

## 2021-05-06 ENCOUNTER — Encounter: Payer: Self-pay | Admitting: Nurse Practitioner

## 2021-05-06 ENCOUNTER — Other Ambulatory Visit: Payer: Self-pay

## 2021-05-06 ENCOUNTER — Ambulatory Visit: Payer: BC Managed Care – PPO | Admitting: Nurse Practitioner

## 2021-05-06 VITALS — BP 130/70 | HR 60 | Temp 98.5°F | Ht 64.0 in | Wt 162.2 lb

## 2021-05-06 DIAGNOSIS — E782 Mixed hyperlipidemia: Secondary | ICD-10-CM | POA: Diagnosis not present

## 2021-05-06 DIAGNOSIS — Z23 Encounter for immunization: Secondary | ICD-10-CM | POA: Diagnosis not present

## 2021-05-06 DIAGNOSIS — R7301 Impaired fasting glucose: Secondary | ICD-10-CM | POA: Diagnosis not present

## 2021-05-06 DIAGNOSIS — R030 Elevated blood-pressure reading, without diagnosis of hypertension: Secondary | ICD-10-CM

## 2021-05-06 MED ORDER — ATORVASTATIN CALCIUM 20 MG PO TABS: 20.0000 mg | ORAL_TABLET | Freq: Every day | ORAL | 4 refills | Status: DC

## 2021-05-06 NOTE — Progress Notes (Signed)
BP 130/70 Comment: home blood pressure   Pulse 60    Temp 98.5 F (36.9 C) (Oral)    Ht 5\' 4"  (1.626 m)    Wt 162 lb 3.2 oz (73.6 kg)    SpO2 100%    BMI 27.84 kg/m    Subjective:    Patient ID: Tiffany Boyer, female    DOB: Mar 07, 1966, 55 y.o.   MRN: [id number]  HPI: Tiffany Boyer is a 56 y.o. female  Chief Complaint  Patient presents with   Hyperlipidemia   HYPERLIPIDEMIA Hyperlipidemia status: excellent compliance Satisfied with current treatment?  no Side effects:  no Medication compliance: excellent compliance Past cholesterol meds: atorvastain (lipitor) Supplements: none Aspirin:  no The 10-year ASCVD risk score (Arnett DK, et al., 2019) is: 5.7%   Values used to calculate the score:     Age: 55 years     Sex: Female     Is Non-Hispanic African American: No     Diabetic: No     Tobacco smoker: Yes     Systolic Blood Pressure: 852 mmHg     Is BP treated: No     HDL Cholesterol: 52 mg/dL     Total Cholesterol: 190 mg/dL Chest pain:  no Coronary artery disease:  no Family history CAD:  no Family history early CAD:  no  ELEVATED BLOOD PRESSURE Patient checks blood pressures at home.  Running 130/70s at home.  Denies Concerns at visit today.    Denies HA, CP, SOB, dizziness, palpitations, visual changes, and lower extremity swelling.   Relevant past medical, surgical, family and social history reviewed and updated as indicated. Interim medical history since our last visit reviewed. Allergies and medications reviewed and updated.  Review of Systems  Eyes:  Negative for visual disturbance.  Respiratory:  Negative for cough, chest tightness and shortness of breath.   Cardiovascular:  Negative for chest pain, palpitations and leg swelling.  Neurological:  Negative for dizziness and headaches.   Per HPI unless specifically indicated above     Objective:    BP 130/70 Comment: home blood pressure   Pulse 60    Temp 98.5 F (36.9 C) (Oral)    Ht 5\' 4"  (1.626  m)    Wt 162 lb 3.2 oz (73.6 kg)    SpO2 100%    BMI 27.84 kg/m   Wt Readings from Last 3 Encounters:  05/06/21 162 lb 3.2 oz (73.6 kg)  11/04/20 159 lb 6 oz (72.3 kg)  04/21/20 157 lb (71.2 kg)    Physical Exam Vitals and nursing note reviewed.  Constitutional:      General: She is not in acute distress.    Appearance: Normal appearance. She is normal weight. She is not ill-appearing, toxic-appearing or diaphoretic.  HENT:     Head: Normocephalic.     Right Ear: External ear normal.     Left Ear: External ear normal.     Nose: Nose normal.     Mouth/Throat:     Mouth: Mucous membranes are moist.     Pharynx: Oropharynx is clear.  Eyes:     General:        Right eye: No discharge.        Left eye: No discharge.     Extraocular Movements: Extraocular movements intact.     Conjunctiva/sclera: Conjunctivae normal.     Pupils: Pupils are equal, round, and reactive to light.  Cardiovascular:     Rate and Rhythm: Normal rate  and regular rhythm.     Heart sounds: No murmur heard. Pulmonary:     Effort: Pulmonary effort is normal. No respiratory distress.     Breath sounds: Normal breath sounds. No wheezing or rales.  Musculoskeletal:     Cervical back: Normal range of motion and neck supple.  Skin:    General: Skin is warm and dry.     Capillary Refill: Capillary refill takes less than 2 seconds.  Neurological:     General: No focal deficit present.     Mental Status: She is alert and oriented to person, place, and time. Mental status is at baseline.  Psychiatric:        Mood and Affect: Mood normal.        Behavior: Behavior normal.        Thought Content: Thought content normal.        Judgment: Judgment normal.    Results for orders placed or performed in visit on 11/04/20  Microscopic Examination   Urine  Result Value Ref Range   WBC, UA None seen 0 - 5 /hpf   RBC 3-10 (A) 0 - 2 /hpf   Epithelial Cells (non renal) 0-10 0 - 10 /hpf   Mucus, UA Present (A) Not Estab.    Bacteria, UA None seen None seen/Few  CBC with Differential/Platelet  Result Value Ref Range   WBC 5.5 3.4 - 10.8 x10E3/uL   RBC 4.41 3.77 - 5.28 x10E6/uL   Hemoglobin 13.8 11.1 - 15.9 g/dL   Hematocrit 40.8 34.0 - 46.6 %   MCV 93 79 - 97 fL   MCH 31.3 26.6 - 33.0 pg   MCHC 33.8 31.5 - 35.7 g/dL   RDW 13.1 11.7 - 15.4 %   Platelets 147 (L) 150 - 450 x10E3/uL   Neutrophils 58 Not Estab. %   Lymphs 34 Not Estab. %   Monocytes 5 Not Estab. %   Eos 2 Not Estab. %   Basos 1 Not Estab. %   Neutrophils Absolute 3.3 1.4 - 7.0 x10E3/uL   Lymphocytes Absolute 1.9 0.7 - 3.1 x10E3/uL   Monocytes Absolute 0.3 0.1 - 0.9 x10E3/uL   EOS (ABSOLUTE) 0.1 0.0 - 0.4 x10E3/uL   Basophils Absolute 0.0 0.0 - 0.2 x10E3/uL   Immature Granulocytes 0 Not Estab. %   Immature Grans (Abs) 0.0 0.0 - 0.1 x10E3/uL  Comprehensive metabolic panel  Result Value Ref Range   Glucose 89 65 - 99 mg/dL   BUN 11 6 - 24 mg/dL   Creatinine, Ser 0.62 0.57 - 1.00 mg/dL   eGFR 104 >59 mL/min/1.73   BUN/Creatinine Ratio 18 9 - 23   Sodium 142 134 - 144 mmol/L   Potassium 4.2 3.5 - 5.2 mmol/L   Chloride 104 96 - 106 mmol/L   CO2 21 20 - 29 mmol/L   Calcium 9.4 8.7 - 10.2 mg/dL   Total Protein 6.6 6.0 - 8.5 g/dL   Albumin 4.6 3.8 - 4.9 g/dL   Globulin, Total 2.0 1.5 - 4.5 g/dL   Albumin/Globulin Ratio 2.3 (H) 1.2 - 2.2   Bilirubin Total 0.4 0.0 - 1.2 mg/dL   Alkaline Phosphatase 111 44 - 121 IU/L   AST 35 0 - 40 IU/L   ALT 22 0 - 32 IU/L  Lipid panel  Result Value Ref Range   Cholesterol, Total 190 100 - 199 mg/dL   Triglycerides 112 0 - 149 mg/dL   HDL 52 >39 mg/dL   VLDL Cholesterol Cal 20  5 - 40 mg/dL   LDL Chol Calc (NIH) 118 (H) 0 - 99 mg/dL   Chol/HDL Ratio 3.7 0.0 - 4.4 ratio  TSH  Result Value Ref Range   TSH 1.290 0.450 - 4.500 uIU/mL  Urinalysis, Routine w reflex microscopic  Result Value Ref Range   Specific Gravity, UA 1.015 1.005 - 1.030   pH, UA 7.0 5.0 - 7.5   Color, UA Yellow Yellow    Appearance Ur Clear Clear   Leukocytes,UA Negative Negative   Protein,UA Negative Negative/Trace   Glucose, UA Negative Negative   Ketones, UA Negative Negative   RBC, UA Trace (A) Negative   Bilirubin, UA Negative Negative   Urobilinogen, Ur 1.0 0.2 - 1.0 mg/dL   Nitrite, UA Negative Negative   Microscopic Examination See below:       Assessment & Plan:   Problem List Items Addressed This Visit       Endocrine   IFG (impaired fasting glucose) - Primary    Labs ordered today. Will make recommendations based on lab results.      Relevant Orders   Comp Met (CMET)   HgB A1c     Other   Hyperlipidemia    Chronic.  Controlled.  Continue with current medication regimen on Atorvastatin $RemoveBeforeD'20mg'[id number]$  daily.  Labs ordered today.  Return to clinic in 6 months for reevaluation.  Call sooner if concerns arise.        Relevant Medications   atorvastatin (LIPITOR) 20 MG tablet   Other Relevant Orders   Comp Met (CMET)   Lipid Profile   Other Visit Diagnoses     White coat syndrome without diagnosis of hypertension       Elevated in office.  However, patient checks blood pressures at home and they are running 130/70s.   Relevant Medications   atorvastatin (LIPITOR) 20 MG tablet   Need for shingles vaccine       Relevant Orders   Varicella-zoster vaccine IM (Shingrix) (Completed)   Need for influenza vaccination       Relevant Orders   Flu Vaccine QUAD 55mo+IM (Fluarix, Fluzone & Alfiuria Quad PF) (Completed)        Follow up plan: Return in about 6 months (around 11/04/2021) for Physical and Fasting labs.

## 2021-05-06 NOTE — Assessment & Plan Note (Signed)
Chronic.  Controlled.  Continue with current medication regimen on Atorvastatin 20mg daily.  Labs ordered today.  Return to clinic in 6 months for reevaluation.  Call sooner if concerns arise.   

## 2021-05-06 NOTE — Assessment & Plan Note (Signed)
Labs ordered today.  Will make recommendations based on lab results. ?

## 2021-05-07 LAB — LIPID PANEL
Chol/HDL Ratio: 3.6 ratio (ref 0.0–4.4)
Cholesterol, Total: 182 mg/dL (ref 100–199)
HDL: 51 mg/dL (ref >39)
LDL Chol Calc (NIH): 113 mg/dL — ABNORMAL HIGH (ref 0–99)
Triglycerides: 101 mg/dL (ref 0–149)
VLDL Cholesterol Cal: 18 mg/dL (ref 5–40)

## 2021-05-07 LAB — COMPREHENSIVE METABOLIC PANEL
ALT: 24 IU/L (ref 0–32)
AST: 32 IU/L (ref 0–40)
Albumin/Globulin Ratio: 1.8 (ref 1.2–2.2)
Albumin: 4.3 g/dL (ref 3.8–4.9)
Alkaline Phosphatase: 103 IU/L (ref 44–121)
BUN/Creatinine Ratio: 16 (ref 9–23)
BUN: 10 mg/dL (ref 6–24)
Bilirubin Total: 0.4 mg/dL (ref 0.0–1.2)
CO2: 25 mmol/L (ref 20–29)
Calcium: 9.3 mg/dL (ref 8.7–10.2)
Chloride: 104 mmol/L (ref 96–106)
Creatinine, Ser: 0.64 mg/dL (ref 0.57–1.00)
Globulin, Total: 2.4 g/dL (ref 1.5–4.5)
Glucose: 85 mg/dL (ref 70–99)
Potassium: 4.5 mmol/L (ref 3.5–5.2)
Sodium: 142 mmol/L (ref 134–144)
Total Protein: 6.7 g/dL (ref 6.0–8.5)
eGFR: 103 mL/min/{1.73_m2} (ref >59)

## 2021-05-07 LAB — HEMOGLOBIN A1C
Est. average glucose Bld gHb Est-mCnc: 103 mg/dL
Hgb A1c MFr Bld: 5.2 % (ref 4.8–5.6)

## 2021-05-07 NOTE — Progress Notes (Signed)
Hi Tiffany Boyer. It was nice to see you yesterday.  Your lab work looks good.  No concerns at this time. Continue with your current medication regimen.  Follow up as discussed.  Please let me know if you have any questions.  

## 2021-05-20 DIAGNOSIS — H52223 Regular astigmatism, bilateral: Secondary | ICD-10-CM | POA: Diagnosis not present

## 2021-05-20 DIAGNOSIS — H5203 Hypermetropia, bilateral: Secondary | ICD-10-CM | POA: Diagnosis not present

## 2021-05-20 DIAGNOSIS — H3562 Retinal hemorrhage, left eye: Secondary | ICD-10-CM | POA: Diagnosis not present

## 2021-05-20 DIAGNOSIS — H524 Presbyopia: Secondary | ICD-10-CM | POA: Diagnosis not present

## 2021-10-05 DIAGNOSIS — L570 Actinic keratosis: Secondary | ICD-10-CM | POA: Diagnosis not present

## 2021-10-05 DIAGNOSIS — L578 Other skin changes due to chronic exposure to nonionizing radiation: Secondary | ICD-10-CM | POA: Diagnosis not present

## 2021-10-05 DIAGNOSIS — D485 Neoplasm of uncertain behavior of skin: Secondary | ICD-10-CM | POA: Diagnosis not present

## 2021-10-05 DIAGNOSIS — Z859 Personal history of malignant neoplasm, unspecified: Secondary | ICD-10-CM | POA: Diagnosis not present

## 2021-10-05 DIAGNOSIS — L821 Other seborrheic keratosis: Secondary | ICD-10-CM | POA: Diagnosis not present

## 2021-10-05 DIAGNOSIS — C4481 Basal cell carcinoma of overlapping sites of skin: Secondary | ICD-10-CM | POA: Diagnosis not present

## 2021-11-04 ENCOUNTER — Encounter: Payer: BC Managed Care – PPO | Admitting: Nurse Practitioner

## 2021-11-25 ENCOUNTER — Encounter: Payer: BC Managed Care – PPO | Admitting: Nurse Practitioner

## 2021-12-04 NOTE — Progress Notes (Unsigned)
There were no vitals taken for this visit.   Subjective:    Patient ID: Tiffany Boyer, female    DOB: Jul 08, 1965, 56 y.o.   MRN: [id number]  HPI: Tiffany Boyer is a 56 y.o. female presenting on 12/07/2021 for comprehensive medical examination. Current medical complaints include:{Blank single:19197::"none","***"}  She currently lives with: Menopausal Symptoms: {Blank single:19197::"yes","no"}  HYPERLIPIDEMIA Hyperlipidemia status: {Blank single:19197::"excellent compliance","good compliance","fair compliance","poor compliance"} Satisfied with current treatment?  {Blank single:19197::"yes","no"} Side effects:  {Blank single:19197::"yes","no"} Medication compliance: {Blank single:19197::"excellent compliance","good compliance","fair compliance","poor compliance"} Past cholesterol meds: {Blank multiple:19196::"none","atorvastain (lipitor)","lovastatin (mevacor)","pravastatin (pravachol)","rosuvastatin (crestor)","simvastatin (zocor)","vytorin","fenofibrate (tricor)","gemfibrozil","ezetimide (zetia)","niaspan","lovaza"} Supplements: {Blank multiple:19196::"none","fish oil","niacin","red yeast rice"} Aspirin:  {Blank single:19197::"yes","no"} The 10-year ASCVD risk score (Arnett DK, et al., 2019) is: 5.6%   Values used to calculate the score:     Age: 62 years     Sex: Female     Is Non-Hispanic African American: No     Diabetic: No     Tobacco smoker: Yes     Systolic Blood Pressure: 250 mmHg     Is BP treated: No     HDL Cholesterol: 51 mg/dL     Total Cholesterol: 182 mg/dL Chest pain:  {Blank single:19197::"yes","no"} Coronary artery disease:  {Blank single:19197::"yes","no"} Family history CAD:  {Blank single:19197::"yes","no"} Family history early CAD:  {Blank single:19197::"yes","no"}  Depression Screen done today and results listed below:     05/06/2021    8:38 AM 11/04/2020    8:53 AM 07/12/2019   10:40 AM  Depression screen PHQ 2/9  Decreased Interest 0 0 2  Down,  Depressed, Hopeless 0 0 0  PHQ - 2 Score 0 0 2  Altered sleeping 2  3  Tired, decreased energy 0  0  Change in appetite 0  1  Feeling bad or failure about yourself  0  0  Trouble concentrating 0  0  Moving slowly or fidgety/restless 0  0  Suicidal thoughts 0  0  PHQ-9 Score 2  6  Difficult doing work/chores Not difficult at all      The patient {has/does not NLZJ:67341} a history of falls. I {did/did not:19850} complete a risk assessment for falls. A plan of care for falls {was/was not:19852} documented.   Past Medical History:  Past Medical History:  Diagnosis Date   Hyperlipidemia     Surgical History:  Past Surgical History:  Procedure Laterality Date   CESAREAN SECTION     COLONOSCOPY WITH PROPOFOL N/A 09/04/2019   Procedure: COLONOSCOPY WITH PROPOFOL;  Surgeon: Jonathon Bellows, MD;  [facility] ENDOSCOPY;  Service: Gastroenterology;  Laterality: N/A;   TONSILLECTOMY      Medications:  Current Outpatient Medications on File Prior to Visit  Medication Sig   atorvastatin (LIPITOR) 20 MG tablet Take 1 tablet (20 mg total) by mouth daily.   Multiple Vitamins-Minerals (WOMENS 50+ MULTI VITAMIN/MIN PO) Take by mouth daily.   Turmeric (QC TUMERIC COMPLEX PO) Take by mouth.   No current facility-administered medications on file prior to visit.    Allergies:  No Known Allergies  Social History:  Social History   Socioeconomic History   Marital status: Married    Spouse name: Not on file   Number of children: Not on file   Years of education: Not on file   Highest education level: Not on file  Occupational History   Not on file  Tobacco Use   Smoking status: Every Day    Packs/day: 0.50    Types: Cigarettes   Smokeless  tobacco: Never  Vaping Use   Vaping Use: Never used  Substance and Sexual Activity   Alcohol use: No   Drug use: No   Sexual activity: Yes  Other Topics Concern   Not on file  Social History Narrative   Not on file   Social Determinants of  Health   Financial Resource Strain: Not on file  Food Insecurity: Not on file  Transportation Needs: Not on file  Physical Activity: Not on file  Stress: Not on file  Social Connections: Not on file  Intimate Partner Violence: Not on file   Social History   Tobacco Use  Smoking Status Every Day   Packs/day: 0.50   Types: Cigarettes  Smokeless Tobacco Never   Social History   Substance and Sexual Activity  Alcohol Use No    Family History:  Family History  Problem Relation Age of Onset   Hypertension Mother    Stroke Brother    Breast cancer Paternal Aunt     Past medical history, surgical history, medications, allergies, family history and social history reviewed with patient today and changes made to appropriate areas of the chart.   ROS All other ROS negative except what is listed above and in the HPI.      Objective:    There were no vitals taken for this visit.  Wt Readings from Last 3 Encounters:  05/06/21 162 lb 3.2 oz (73.6 kg)  11/04/20 159 lb 6 oz (72.3 kg)  04/21/20 157 lb (71.2 kg)    Physical Exam  Results for orders placed or performed in visit on 05/06/21  Comp Met (CMET)  Result Value Ref Range   Glucose 85 70 - 99 mg/dL   BUN 10 6 - 24 mg/dL   Creatinine, Ser 0.64 0.57 - 1.00 mg/dL   eGFR 103 >59 mL/min/1.73   BUN/Creatinine Ratio 16 9 - 23   Sodium 142 134 - 144 mmol/L   Potassium 4.5 3.5 - 5.2 mmol/L   Chloride 104 96 - 106 mmol/L   CO2 25 20 - 29 mmol/L   Calcium 9.3 8.7 - 10.2 mg/dL   Total Protein 6.7 6.0 - 8.5 g/dL   Albumin 4.3 3.8 - 4.9 g/dL   Globulin, Total 2.4 1.5 - 4.5 g/dL   Albumin/Globulin Ratio 1.8 1.2 - 2.2   Bilirubin Total 0.4 0.0 - 1.2 mg/dL   Alkaline Phosphatase 103 44 - 121 IU/L   AST 32 0 - 40 IU/L   ALT 24 0 - 32 IU/L  HgB A1c  Result Value Ref Range   Hgb A1c MFr Bld 5.2 4.8 - 5.6 %   Est. average glucose Bld gHb Est-mCnc 103 mg/dL  Lipid Profile  Result Value Ref Range   Cholesterol, Total 182 100 -  199 mg/dL   Triglycerides 101 0 - 149 mg/dL   HDL 51 >39 mg/dL   VLDL Cholesterol Cal 18 5 - 40 mg/dL   LDL Chol Calc (NIH) 113 (H) 0 - 99 mg/dL   Chol/HDL Ratio 3.6 0.0 - 4.4 ratio      Assessment & Plan:   Problem List Items Addressed This Visit       Endocrine   IFG (impaired fasting glucose)     Other   Hyperlipidemia   Other Visit Diagnoses     Annual physical exam    -  Primary        Follow up plan: No follow-ups on file.   LABORATORY TESTING:  - Pap  smear: {Blank VOHYWV:37106::"YIR done","not applicable","up to date","done elsewhere"}  IMMUNIZATIONS:   - Tdap: Tetanus vaccination status reviewed: {tetanus status:315746}. - Influenza: {Blank single:19197::"Up to date","Administered today","Postponed to flu season","Refused","Given elsewhere"} - Pneumovax: {Blank single:19197::"Up to date","Administered today","Not applicable","Refused","Given elsewhere"} - Prevnar: {Blank single:19197::"Up to date","Administered today","Not applicable","Refused","Given elsewhere"} - COVID: {Blank single:19197::"Up to date","Administered today","Not applicable","Refused","Given elsewhere"} - HPV: {Blank single:19197::"Up to date","Administered today","Not applicable","Refused","Given elsewhere"} - Shingrix vaccine: {Blank single:19197::"Up to date","Administered today","Not applicable","Refused","Given elsewhere"}  SCREENING: -Mammogram: {Blank single:19197::"Up to date","Ordered today","Not applicable","Refused","Done elsewhere"}  - Colonoscopy: {Blank single:19197::"Up to date","Ordered today","Not applicable","Refused","Done elsewhere"}  - Bone Density: {Blank single:19197::"Up to date","Ordered today","Not applicable","Refused","Done elsewhere"}  -Hearing Test: {Blank single:19197::"Up to date","Ordered today","Not applicable","Refused","Done elsewhere"}  -Spirometry: {Blank single:19197::"Up to date","Ordered today","Not applicable","Refused","Done elsewhere"}   PATIENT  COUNSELING:   Advised to take 1 mg of folate supplement per day if capable of pregnancy.   Sexuality: Discussed sexually transmitted diseases, partner selection, use of condoms, avoidance of unintended pregnancy  and contraceptive alternatives.   Advised to avoid cigarette smoking.  I discussed with the patient that most people either abstain from alcohol or drink within safe limits (<=14/week and <=4 drinks/occasion for males, <=7/weeks and <= 3 drinks/occasion for females) and that the risk for alcohol disorders and other health effects rises proportionally with the number of drinks per week and how often a drinker exceeds daily limits.  Discussed cessation/primary prevention of drug use and availability of treatment for abuse.   Diet: Encouraged to adjust caloric intake to maintain  or achieve ideal body weight, to reduce intake of dietary saturated fat and total fat, to limit sodium intake by avoiding high sodium foods and not adding table salt, and to maintain adequate dietary potassium and calcium preferably from fresh fruits, vegetables, and low-fat dairy products.    stressed the importance of regular exercise  Injury prevention: Discussed safety belts, safety helmets, smoke detector, smoking near bedding or upholstery.   Dental health: Discussed importance of regular tooth brushing, flossing, and dental visits.    NEXT PREVENTATIVE PHYSICAL DUE IN 1 YEAR. No follow-ups on file.

## 2021-12-07 ENCOUNTER — Ambulatory Visit (INDEPENDENT_AMBULATORY_CARE_PROVIDER_SITE_OTHER): Payer: BC Managed Care – PPO | Admitting: Nurse Practitioner

## 2021-12-07 ENCOUNTER — Encounter: Payer: Self-pay | Admitting: Nurse Practitioner

## 2021-12-07 VITALS — BP 128/84 | HR 69 | Temp 98.1°F | Ht 65.35 in | Wt 174.4 lb

## 2021-12-07 DIAGNOSIS — E782 Mixed hyperlipidemia: Secondary | ICD-10-CM | POA: Diagnosis not present

## 2021-12-07 DIAGNOSIS — F17200 Nicotine dependence, unspecified, uncomplicated: Secondary | ICD-10-CM | POA: Diagnosis not present

## 2021-12-07 DIAGNOSIS — R8290 Unspecified abnormal findings in urine: Secondary | ICD-10-CM

## 2021-12-07 DIAGNOSIS — R7301 Impaired fasting glucose: Secondary | ICD-10-CM

## 2021-12-07 DIAGNOSIS — Z0000 Encounter for general adult medical examination without abnormal findings: Secondary | ICD-10-CM | POA: Diagnosis not present

## 2021-12-07 LAB — MICROSCOPIC EXAMINATION
Bacteria, UA: NONE SEEN
WBC, UA: NONE SEEN /hpf (ref 0–5)

## 2021-12-07 LAB — URINALYSIS, ROUTINE W REFLEX MICROSCOPIC
Bilirubin, UA: NEGATIVE
Glucose, UA: NEGATIVE
Ketones, UA: NEGATIVE
Nitrite, UA: NEGATIVE
Protein,UA: NEGATIVE
Specific Gravity, UA: 1.015 (ref 1.005–1.030)
Urobilinogen, Ur: 1 mg/dL (ref 0.2–1.0)
pH, UA: 7.5 (ref 5.0–7.5)

## 2021-12-07 NOTE — Assessment & Plan Note (Addendum)
Chronic.  Controlled.  Continue with current medication regimen of Atorvastatin 20mg daily.  Labs ordered today.  Return to clinic in 6 months for reevaluation.  Call sooner if concerns arise.   

## 2021-12-07 NOTE — Addendum Note (Signed)
Addended by: Jon Billings on: 12/07/2021 10:18 AM   Modules accepted: Orders

## 2021-12-07 NOTE — Assessment & Plan Note (Signed)
Labs ordered today.  Will make recommendations based on lab results. ?

## 2021-12-08 DIAGNOSIS — L988 Other specified disorders of the skin and subcutaneous tissue: Secondary | ICD-10-CM | POA: Diagnosis not present

## 2021-12-08 DIAGNOSIS — C4491 Basal cell carcinoma of skin, unspecified: Secondary | ICD-10-CM | POA: Diagnosis not present

## 2021-12-08 LAB — CBC WITH DIFFERENTIAL/PLATELET
Basophils Absolute: 0 10*3/uL (ref 0.0–0.2)
Basos: 1 %
EOS (ABSOLUTE): 0.1 10*3/uL (ref 0.0–0.4)
Eos: 2 %
Hematocrit: 42.9 % (ref 34.0–46.6)
Hemoglobin: 13.9 g/dL (ref 11.1–15.9)
Immature Grans (Abs): 0 10*3/uL (ref 0.0–0.1)
Immature Granulocytes: 0 %
Lymphocytes Absolute: 1.8 10*3/uL (ref 0.7–3.1)
Lymphs: 29 %
MCH: 29.4 pg (ref 26.6–33.0)
MCHC: 32.4 g/dL (ref 31.5–35.7)
MCV: 91 fL (ref 79–97)
Monocytes Absolute: 0.3 10*3/uL (ref 0.1–0.9)
Monocytes: 4 %
Neutrophils Absolute: 4 10*3/uL (ref 1.4–7.0)
Neutrophils: 64 %
Platelets: 153 10*3/uL (ref 150–450)
RBC: 4.72 x10E6/uL (ref 3.77–5.28)
RDW: 13.2 % (ref 11.7–15.4)
WBC: 6.2 10*3/uL (ref 3.4–10.8)

## 2021-12-08 LAB — LIPID PANEL
Chol/HDL Ratio: 3.3 ratio (ref 0.0–4.4)
Cholesterol, Total: 178 mg/dL (ref 100–199)
HDL: 54 mg/dL (ref >39)
LDL Chol Calc (NIH): 109 mg/dL — ABNORMAL HIGH (ref 0–99)
Triglycerides: 83 mg/dL (ref 0–149)
VLDL Cholesterol Cal: 15 mg/dL (ref 5–40)

## 2021-12-08 LAB — COMPREHENSIVE METABOLIC PANEL
ALT: 31 IU/L (ref 0–32)
AST: 47 IU/L — ABNORMAL HIGH (ref 0–40)
Albumin/Globulin Ratio: 2.4 — ABNORMAL HIGH (ref 1.2–2.2)
Albumin: 4.6 g/dL (ref 3.8–4.9)
Alkaline Phosphatase: 117 IU/L (ref 44–121)
BUN/Creatinine Ratio: 15 (ref 9–23)
BUN: 11 mg/dL (ref 6–24)
Bilirubin Total: 0.4 mg/dL (ref 0.0–1.2)
CO2: 22 mmol/L (ref 20–29)
Calcium: 9.3 mg/dL (ref 8.7–10.2)
Chloride: 105 mmol/L (ref 96–106)
Creatinine, Ser: 0.71 mg/dL (ref 0.57–1.00)
Globulin, Total: 1.9 g/dL (ref 1.5–4.5)
Glucose: 106 mg/dL — ABNORMAL HIGH (ref 70–99)
Potassium: 4.1 mmol/L (ref 3.5–5.2)
Sodium: 144 mmol/L (ref 134–144)
Total Protein: 6.5 g/dL (ref 6.0–8.5)
eGFR: 98 mL/min/{1.73_m2} (ref >59)

## 2021-12-08 LAB — TSH: TSH: 0.998 u[IU]/mL (ref 0.450–4.500)

## 2021-12-08 LAB — HEMOGLOBIN A1C
Est. average glucose Bld gHb Est-mCnc: 103 mg/dL
Hgb A1c MFr Bld: 5.2 % (ref 4.8–5.6)

## 2021-12-08 NOTE — Progress Notes (Signed)
Hi Marcie Bal. It was nice to see you yesterday.  Your lab work looks good.  No concerns at this time. Continue with your current medication regimen.  Follow up as discussed.  Please let me know if you have any questions.

## 2021-12-09 LAB — URINE CULTURE

## 2022-03-16 DIAGNOSIS — H52223 Regular astigmatism, bilateral: Secondary | ICD-10-CM | POA: Diagnosis not present

## 2022-03-16 DIAGNOSIS — H3562 Retinal hemorrhage, left eye: Secondary | ICD-10-CM | POA: Diagnosis not present

## 2022-03-16 DIAGNOSIS — H5203 Hypermetropia, bilateral: Secondary | ICD-10-CM | POA: Diagnosis not present

## 2022-03-16 DIAGNOSIS — H524 Presbyopia: Secondary | ICD-10-CM | POA: Diagnosis not present

## 2022-06-08 NOTE — Progress Notes (Unsigned)
There were no vitals taken for this visit.   Subjective:    Patient ID: Tiffany Boyer, female    DOB: May 15, 1966, 57 y.o.   MRN: [id number]  HPI: Tiffany Boyer is a 57 y.o. female  No chief complaint on file.  HYPERLIPIDEMIA Hyperlipidemia status: excellent compliance Satisfied with current treatment?  no Side effects:  no Medication compliance: excellent compliance Past cholesterol meds: atorvastain (lipitor) Supplements: none Aspirin:  no The 10-year ASCVD risk score (Arnett DK, et al., 2019) is: 5.5%   Values used to calculate the score:     Age: 56 years     Sex: Female     Is Non-Hispanic African American: No     Diabetic: No     Tobacco smoker: Yes     Systolic Blood Pressure: 409 mmHg     Is BP treated: No     HDL Cholesterol: 54 mg/dL     Total Cholesterol: 178 mg/dL Chest pain:  no Coronary artery disease:  no Family history CAD:  no Family history early CAD:  no  ELEVATED BLOOD PRESSURE Patient checks blood pressures at home.  Running 130/70s at home.  Denies Concerns at visit today.    Denies HA, CP, SOB, dizziness, palpitations, visual changes, and lower extremity swelling.   Relevant past medical, surgical, family and social history reviewed and updated as indicated. Interim medical history since our last visit reviewed. Allergies and medications reviewed and updated.  Review of Systems  Eyes:  Negative for visual disturbance.  Respiratory:  Negative for cough, chest tightness and shortness of breath.   Cardiovascular:  Negative for chest pain, palpitations and leg swelling.  Neurological:  Negative for dizziness and headaches.    Per HPI unless specifically indicated above     Objective:    There were no vitals taken for this visit.  Wt Readings from Last 3 Encounters:  12/07/21 174 lb 6.4 oz (79.1 kg)  05/06/21 162 lb 3.2 oz (73.6 kg)  11/04/20 159 lb 6 oz (72.3 kg)    Physical Exam Vitals and nursing note reviewed.  Constitutional:       General: She is not in acute distress.    Appearance: Normal appearance. She is normal weight. She is not ill-appearing, toxic-appearing or diaphoretic.  HENT:     Head: Normocephalic.     Right Ear: External ear normal.     Left Ear: External ear normal.     Nose: Nose normal.     Mouth/Throat:     Mouth: Mucous membranes are moist.     Pharynx: Oropharynx is clear.  Eyes:     General:        Right eye: No discharge.        Left eye: No discharge.     Extraocular Movements: Extraocular movements intact.     Conjunctiva/sclera: Conjunctivae normal.     Pupils: Pupils are equal, round, and reactive to light.  Cardiovascular:     Rate and Rhythm: Normal rate and regular rhythm.     Heart sounds: No murmur heard. Pulmonary:     Effort: Pulmonary effort is normal. No respiratory distress.     Breath sounds: Normal breath sounds. No wheezing or rales.  Musculoskeletal:     Cervical back: Normal range of motion and neck supple.  Skin:    General: Skin is warm and dry.     Capillary Refill: Capillary refill takes less than 2 seconds.  Neurological:     General:  No focal deficit present.     Mental Status: She is alert and oriented to person, place, and time. Mental status is at baseline.  Psychiatric:        Mood and Affect: Mood normal.        Behavior: Behavior normal.        Thought Content: Thought content normal.        Judgment: Judgment normal.     Results for orders placed or performed in visit on 12/07/21  Microscopic Examination   Urine  Result Value Ref Range   WBC, UA None seen 0 - 5 /hpf   RBC, Urine 3-10 (A) 0 - 2 /hpf   Epithelial Cells (non renal) 0-10 0 - 10 /hpf   Mucus, UA Present (A) Not Estab.   Bacteria, UA None seen None seen/Few  Urine Culture   Specimen: Urine   UR  Result Value Ref Range   Urine Culture, Routine Final report    Organism ID, Bacteria Comment   CBC with Differential/Platelet  Result Value Ref Range   WBC 6.2 3.4 - 10.8  x10E3/uL   RBC 4.72 3.77 - 5.28 x10E6/uL   Hemoglobin 13.9 11.1 - 15.9 g/dL   Hematocrit 42.9 34.0 - 46.6 %   MCV 91 79 - 97 fL   MCH 29.4 26.6 - 33.0 pg   MCHC 32.4 31.5 - 35.7 g/dL   RDW 13.2 11.7 - 15.4 %   Platelets 153 150 - 450 x10E3/uL   Neutrophils 64 Not Estab. %   Lymphs 29 Not Estab. %   Monocytes 4 Not Estab. %   Eos 2 Not Estab. %   Basos 1 Not Estab. %   Neutrophils Absolute 4.0 1.4 - 7.0 x10E3/uL   Lymphocytes Absolute 1.8 0.7 - 3.1 x10E3/uL   Monocytes Absolute 0.3 0.1 - 0.9 x10E3/uL   EOS (ABSOLUTE) 0.1 0.0 - 0.4 x10E3/uL   Basophils Absolute 0.0 0.0 - 0.2 x10E3/uL   Immature Granulocytes 0 Not Estab. %   Immature Grans (Abs) 0.0 0.0 - 0.1 x10E3/uL  Comprehensive metabolic panel  Result Value Ref Range   Glucose 106 (H) 70 - 99 mg/dL   BUN 11 6 - 24 mg/dL   Creatinine, Ser 0.71 0.57 - 1.00 mg/dL   eGFR 98 >59 mL/min/1.73   BUN/Creatinine Ratio 15 9 - 23   Sodium 144 134 - 144 mmol/L   Potassium 4.1 3.5 - 5.2 mmol/L   Chloride 105 96 - 106 mmol/L   CO2 22 20 - 29 mmol/L   Calcium 9.3 8.7 - 10.2 mg/dL   Total Protein 6.5 6.0 - 8.5 g/dL   Albumin 4.6 3.8 - 4.9 g/dL   Globulin, Total 1.9 1.5 - 4.5 g/dL   Albumin/Globulin Ratio 2.4 (H) 1.2 - 2.2   Bilirubin Total 0.4 0.0 - 1.2 mg/dL   Alkaline Phosphatase 117 44 - 121 IU/L   AST 47 (H) 0 - 40 IU/L   ALT 31 0 - 32 IU/L  Lipid panel  Result Value Ref Range   Cholesterol, Total 178 100 - 199 mg/dL   Triglycerides 83 0 - 149 mg/dL   HDL 54 >39 mg/dL   VLDL Cholesterol Cal 15 5 - 40 mg/dL   LDL Chol Calc (NIH) 109 (H) 0 - 99 mg/dL   Chol/HDL Ratio 3.3 0.0 - 4.4 ratio  TSH  Result Value Ref Range   TSH 0.998 0.450 - 4.500 uIU/mL  Urinalysis, Routine w reflex microscopic  Result Value Ref Range  Specific Gravity, UA 1.015 1.005 - 1.030   pH, UA 7.5 5.0 - 7.5   Color, UA Yellow Yellow   Appearance Ur Clear Clear   Leukocytes,UA Trace (A) Negative   Protein,UA Negative Negative/Trace   Glucose, UA  Negative Negative   Ketones, UA Negative Negative   RBC, UA Trace (A) Negative   Bilirubin, UA Negative Negative   Urobilinogen, Ur 1.0 0.2 - 1.0 mg/dL   Nitrite, UA Negative Negative   Microscopic Examination See below:   HgB A1c  Result Value Ref Range   Hgb A1c MFr Bld 5.2 4.8 - 5.6 %   Est. average glucose Bld gHb Est-mCnc 103 mg/dL      Assessment & Plan:   Problem List Items Addressed This Visit       Endocrine   IFG (impaired fasting glucose) - Primary     Other   Hyperlipidemia     Follow up plan: No follow-ups on file.

## 2022-06-09 ENCOUNTER — Encounter: Payer: Self-pay | Admitting: Nurse Practitioner

## 2022-06-09 ENCOUNTER — Ambulatory Visit: Payer: BC Managed Care – PPO | Admitting: Nurse Practitioner

## 2022-06-09 VITALS — BP 136/69 | HR 70 | Temp 97.6°F | Wt 178.1 lb

## 2022-06-09 DIAGNOSIS — Z23 Encounter for immunization: Secondary | ICD-10-CM

## 2022-06-09 DIAGNOSIS — R7301 Impaired fasting glucose: Secondary | ICD-10-CM

## 2022-06-09 DIAGNOSIS — E782 Mixed hyperlipidemia: Secondary | ICD-10-CM

## 2022-06-09 DIAGNOSIS — Z1231 Encounter for screening mammogram for malignant neoplasm of breast: Secondary | ICD-10-CM

## 2022-06-09 MED ORDER — ATORVASTATIN CALCIUM 20 MG PO TABS: 20.0000 mg | ORAL_TABLET | Freq: Every day | ORAL | 1 refills | Status: DC

## 2022-06-09 NOTE — Assessment & Plan Note (Signed)
Chronic.  Controlled.  Continue with current medication regimen of Atorvastatin 38m daily.  Refills sent today.  Labs ordered today.  Return to clinic in 6 months for reevaluation.  Call sooner if concerns arise.

## 2022-06-09 NOTE — Patient Instructions (Signed)
Please call to schedule your mammogram at:  [facility] [place]: [address], [place], [place] [address] Phone: [phone]  [place] [facility] [address] [place],  [place]  [address] Phone: [phone]

## 2022-06-09 NOTE — Assessment & Plan Note (Signed)
Chronic.  Controlled.  Continue with current medication regimen.  Labs ordered today.  Return to clinic in 6 months for reevaluation.  Call sooner if concerns arise.  ? ?

## 2022-06-10 LAB — LIPID PANEL
Chol/HDL Ratio: 3.7 ratio (ref 0.0–4.4)
Cholesterol, Total: 207 mg/dL — ABNORMAL HIGH (ref 100–199)
HDL: 56 mg/dL (ref >39)
LDL Chol Calc (NIH): 131 mg/dL — ABNORMAL HIGH (ref 0–99)
Triglycerides: 110 mg/dL (ref 0–149)
VLDL Cholesterol Cal: 20 mg/dL (ref 5–40)

## 2022-06-10 LAB — COMPREHENSIVE METABOLIC PANEL
ALT: 20 IU/L (ref 0–32)
AST: 33 IU/L (ref 0–40)
Albumin/Globulin Ratio: 2 (ref 1.2–2.2)
Albumin: 4.7 g/dL (ref 3.8–4.9)
Alkaline Phosphatase: 121 IU/L (ref 44–121)
BUN/Creatinine Ratio: 17 (ref 9–23)
BUN: 12 mg/dL (ref 6–24)
Bilirubin Total: 0.4 mg/dL (ref 0.0–1.2)
CO2: 24 mmol/L (ref 20–29)
Calcium: 9.6 mg/dL (ref 8.7–10.2)
Chloride: 104 mmol/L (ref 96–106)
Creatinine, Ser: 0.72 mg/dL (ref 0.57–1.00)
Globulin, Total: 2.3 g/dL (ref 1.5–4.5)
Glucose: 101 mg/dL — ABNORMAL HIGH (ref 70–99)
Potassium: 4.2 mmol/L (ref 3.5–5.2)
Sodium: 142 mmol/L (ref 134–144)
Total Protein: 7 g/dL (ref 6.0–8.5)
eGFR: 97 mL/min/{1.73_m2} (ref >59)

## 2022-06-10 LAB — HEMOGLOBIN A1C
Est. average glucose Bld gHb Est-mCnc: 108 mg/dL
Hgb A1c MFr Bld: 5.4 % (ref 4.8–5.6)

## 2022-06-10 NOTE — Progress Notes (Signed)
HI Marcie Bal. It was nice to see you yesterday.  Your lab work looks good.  Your cholesterol is elevated.  I recommend a low fat diet and exercise.  No other concerns at this time. Continue with your current medication regimen.  Follow up as discussed.  Please let me know if you have any questions.

## 2022-07-15 ENCOUNTER — Ambulatory Visit
Admission: RE | Admit: 2022-07-15 | Discharge: 2022-07-15 | Disposition: A | Discharge status: Home / Self Care | Payer: BC Managed Care – PPO | Source: Ambulatory Visit | Attending: Nurse Practitioner | Admitting: Nurse Practitioner

## 2022-07-15 DIAGNOSIS — Z1231 Encounter for screening mammogram for malignant neoplasm of breast: Secondary | ICD-10-CM | POA: Insufficient documentation

## 2022-07-19 NOTE — Progress Notes (Signed)
Please let patient know her Mammogram did not show any evidence of a malignancy.  The recommendation is to repeat the Mammogram in 1 year.  

## 2022-08-17 ENCOUNTER — Encounter: Payer: Self-pay | Admitting: Nurse Practitioner

## 2022-10-06 DIAGNOSIS — Z859 Personal history of malignant neoplasm, unspecified: Secondary | ICD-10-CM | POA: Diagnosis not present

## 2022-10-06 DIAGNOSIS — L578 Other skin changes due to chronic exposure to nonionizing radiation: Secondary | ICD-10-CM | POA: Diagnosis not present

## 2022-10-06 DIAGNOSIS — Z872 Personal history of diseases of the skin and subcutaneous tissue: Secondary | ICD-10-CM | POA: Diagnosis not present

## 2022-10-06 DIAGNOSIS — Z85828 Personal history of other malignant neoplasm of skin: Secondary | ICD-10-CM | POA: Diagnosis not present

## 2022-12-02 ENCOUNTER — Other Ambulatory Visit: Payer: Self-pay | Admitting: Nurse Practitioner

## 2022-12-02 NOTE — Telephone Encounter (Signed)
Requested Prescriptions  Pending Prescriptions Disp Refills   atorvastatin (LIPITOR) 20 MG tablet [Pharmacy Med Name: ATORVASTATIN 20 MG TABLET] 90 tablet 0    Sig: TAKE 1 TABLET BY MOUTH EVERY DAY     Cardiovascular:  Antilipid - Statins Failed - 12/02/2022  2:34 AM      Failed - Lipid Panel in normal range within the last 12 months    Cholesterol, Total  Date Value Ref Range Status  06/09/2022 207 (H) 100 - 199 mg/dL Final   LDL Chol Calc (NIH)  Date Value Ref Range Status  06/09/2022 131 (H) 0 - 99 mg/dL Final   HDL  Date Value Ref Range Status  06/09/2022 56 >39 mg/dL Final   Triglycerides  Date Value Ref Range Status  06/09/2022 110 0 - 149 mg/dL Final         Passed - Patient is not pregnant      Passed - Valid encounter within last 12 months    Recent Outpatient Visits           5 months ago IFG (impaired fasting glucose)   [facility] Larae Grooms, NP   12 months ago Annual physical exam   [facility] Larae Grooms, NP   1 year ago IFG (impaired fasting glucose)   [facility] Larae Grooms, NP   2 years ago Annual physical exam   [facility] Larae Grooms, NP   2 years ago Mixed hyperlipidemia   [facility] [place], Dorie Rank, NP       Future Appointments             In 1 week Pearley, Sherran Needs, NP [facility]

## 2022-12-10 ENCOUNTER — Ambulatory Visit (INDEPENDENT_AMBULATORY_CARE_PROVIDER_SITE_OTHER): Payer: BC Managed Care – PPO | Admitting: Family Medicine

## 2022-12-10 ENCOUNTER — Encounter: Payer: Self-pay | Admitting: Family Medicine

## 2022-12-10 VITALS — BP 143/76 | HR 77 | Ht 64.0 in | Wt 177.8 lb

## 2022-12-10 DIAGNOSIS — Z1211 Encounter for screening for malignant neoplasm of colon: Secondary | ICD-10-CM

## 2022-12-10 DIAGNOSIS — R7301 Impaired fasting glucose: Secondary | ICD-10-CM

## 2022-12-10 DIAGNOSIS — E782 Mixed hyperlipidemia: Secondary | ICD-10-CM

## 2022-12-10 DIAGNOSIS — Z122 Encounter for screening for malignant neoplasm of respiratory organs: Secondary | ICD-10-CM | POA: Diagnosis not present

## 2022-12-10 DIAGNOSIS — Z0000 Encounter for general adult medical examination without abnormal findings: Secondary | ICD-10-CM

## 2022-12-10 DIAGNOSIS — Z716 Tobacco abuse counseling: Secondary | ICD-10-CM

## 2022-12-10 LAB — URINALYSIS, ROUTINE W REFLEX MICROSCOPIC
Bilirubin, UA: NEGATIVE
Glucose, UA: NEGATIVE
Ketones, UA: NEGATIVE
Nitrite, UA: NEGATIVE
Specific Gravity, UA: 1.02 (ref 1.005–1.030)
Urobilinogen, Ur: 1 mg/dL (ref 0.2–1.0)
pH, UA: 7.5 (ref 5.0–7.5)

## 2022-12-10 LAB — MICROSCOPIC EXAMINATION: Bacteria, UA: NONE SEEN

## 2022-12-10 LAB — BAYER DCA HB A1C WAIVED: HB A1C (BAYER DCA - WAIVED): 5.3 % (ref 4.8–5.6)

## 2022-12-10 MED ORDER — ATORVASTATIN CALCIUM 20 MG PO TABS: 20.0000 mg | ORAL_TABLET | Freq: Every day | ORAL | 0 refills | Status: DC

## 2022-12-10 NOTE — Assessment & Plan Note (Signed)
Chronic.  Controlled.  Continue with current medication regimen of Atorvastatin 20mg daily.  Refills sent today.  Labs ordered today.  Return to clinic in 6 months for reevaluation.  Call sooner if concerns arise.   

## 2022-12-10 NOTE — Assessment & Plan Note (Signed)
Chronic, ongoing. Recheck A1c and CMP today.

## 2022-12-10 NOTE — Progress Notes (Signed)
BP (!) 143/76   Pulse 77   Ht 5\' 4"  (1.626 m)   Wt 177 lb 12.8 oz (80.6 kg)   SpO2 98%   BMI 30.52 kg/m    Subjective:    Patient ID: Tiffany Boyer, female    DOB: 03-20-1966, 57 y.o.   MRN: [id number]  HPI: Tiffany Boyer is a 57 y.o. female presenting on 12/10/2022 for comprehensive medical examination. Current medical complaints include:none  She currently lives with: Spouse Menopausal Symptoms: no   HYPERLIPIDEMIA She is taking Atorvastatin 20 mg daily. She is exercising 5 days weekly for 30 minutes. Her diet consist of fruits, vegetables, dairy, and protein. She is drinking 80 oz of water daily and x2 cups of coffee. Hyperlipidemia status: excellent compliance Satisfied with current treatment?  yes Side effects:  no Medication compliance: excellent compliance Past cholesterol meds: none Supplements: fish oil  Aspirin:  no The 10-year ASCVD risk score (Arnett DK, et al., 2019) is: 8%   Values used to calculate the score:     Age: 22 years     Sex: Female     Is Non-Hispanic African American: No     Diabetic: No     Tobacco smoker: Yes     Systolic Blood Pressure: 143 mmHg     Is BP treated: No     HDL Cholesterol: 56 mg/dL     Total Cholesterol: 207 mg/dL Chest pain:  no Coronary artery disease:  yes Family history CAD:  yes Family history early CAD:  yes   Functional Status Survey:       12/10/2022    8:35 AM 06/09/2022    9:04 AM 12/07/2021    9:24 AM 05/06/2021    8:38 AM 11/04/2020    8:53 AM  Fall Risk   Falls in the past year? 0 0 0 0 1  Number falls in past yr: 0 0 0 0 0  Injury with Fall? 0 0 0 0 0  Risk for fall due to : No Fall Risks No Fall Risks No Fall Risks No Fall Risks No Fall Risks  Follow up Falls evaluation completed Falls evaluation completed Falls evaluation completed Falls evaluation completed Falls evaluation completed    Depression Screen    12/10/2022    8:35 AM 06/09/2022    9:04 AM 12/07/2021    9:24 AM 05/06/2021    8:38  AM 11/04/2020    8:53 AM  Depression screen PHQ 2/9  Decreased Interest 0 1 3 0 0  Down, Depressed, Hopeless 0 0 1 0 0  PHQ - 2 Score 0 1 4 0 0  Altered sleeping 1 1 2 2    Tired, decreased energy 1 0 1 0   Change in appetite 1 1 1  0   Feeling bad or failure about yourself  0 0 0 0   Trouble concentrating 0 0 0 0   Moving slowly or fidgety/restless 0 0 0 0   Suicidal thoughts 0 0 0 0   PHQ-9 Score 3 3 8 2    Difficult doing work/chores Not difficult at all Not difficult at all Not difficult at all Not difficult at all       12/10/2022    8:35 AM 06/09/2022    9:04 AM 12/07/2021    9:24 AM 05/06/2021    8:38 AM  GAD 7 : Generalized Anxiety Score  Nervous, Anxious, on Edge 1 1 1 2   Control/stop worrying 2 2 1 2   Worry  too much - different things 1 1 1 2   Trouble relaxing 1 1 1 1   Restless 1 0 0 0  Easily annoyed or irritable 1 1 0 0  Afraid - awful might happen 0 0 0 0  Total GAD 7 Score 7 6 4 7   Anxiety Difficulty Not difficult at all Not difficult at all Not difficult at all Not difficult at all    Advanced Directives Does patient have a HCPOA?    no If yes, name and contact information:  Does patient have a living will or MOST form?  no  Past Medical History:  Past Medical History:  Diagnosis Date   Hyperlipidemia     Surgical History:  Past Surgical History:  Procedure Laterality Date   CESAREAN SECTION     COLONOSCOPY WITH PROPOFOL N/A 09/04/2019   Procedure: COLONOSCOPY WITH PROPOFOL;  Surgeon: Wyline Mood, MD;  Location: [facility] ENDOSCOPY;  Service: Gastroenterology;  Laterality: N/A;   TONSILLECTOMY      Medications:  Current Outpatient Medications on File Prior to Visit  Medication Sig   Multiple Vitamins-Minerals (WOMENS 50+ MULTI VITAMIN/MIN PO) Take by mouth daily.   Turmeric (QC TUMERIC COMPLEX PO) Take by mouth.   No current facility-administered medications on file prior to visit.    Allergies:  No Known Allergies  Social History:  Social History    Socioeconomic History   Marital status: Married    Spouse name: Not on file   Number of children: Not on file   Years of education: Not on file   Highest education level: Not on file  Occupational History   Not on file  Tobacco Use   Smoking status: Every Day    Current packs/day: 0.50    Average packs/day: 0.5 packs/day for 41.0 years (20.5 ttl pk-yrs)    Types: Cigarettes    Start date: 11/27/1981   Smokeless tobacco: Never  Vaping Use   Vaping status: Never Used  Substance and Sexual Activity   Alcohol use: No   Drug use: No   Sexual activity: Yes  Other Topics Concern   Not on file  Social History Narrative   Not on file   Social Determinants of Health   Financial Resource Strain: Low Risk  (12/10/2022)   Overall Financial Resource Strain (CARDIA)    Difficulty of Paying Living Expenses: Not hard at all  Food Insecurity: No Food Insecurity (12/10/2022)   Hunger Vital Sign    Worried About Running Out of Food in the Last Year: Never true    Ran Out of Food in the Last Year: Never true  Transportation Needs: No Transportation Needs (12/10/2022)   PRAPARE - Administrator, Civil Service (Medical): No    Lack of Transportation (Non-Medical): No  Physical Activity: Sufficiently Active (12/10/2022)   Exercise Vital Sign    Days of Exercise per Week: 5 days    Minutes of Exercise per Session: 30 min  Stress: Stress Concern Present (12/10/2022)   [employer] of Occupational Health - Occupational Stress Questionnaire    Feeling of Stress : To some extent  Social Connections: Not on file  Intimate Partner Violence: Not At Risk (12/10/2022)   Humiliation, Afraid, Rape, and Kick questionnaire    Fear of Current or Ex-Partner: No    Emotionally Abused: No    Physically Abused: No    Sexually Abused: No   Social History   Tobacco Use  Smoking Status Every Day   Current packs/day: 0.50  Average packs/day: 0.5 packs/day for 41.0 years (20.5 ttl pk-yrs)    Types: Cigarettes   Start date: 11/27/1981  Smokeless Tobacco Never   Social History   Substance and Sexual Activity  Alcohol Use No    Family History:  Family History  Problem Relation Age of Onset   Hypertension Mother    Stroke Brother    Breast cancer Paternal Aunt     Past medical history, surgical history, medications, allergies, family history and social history reviewed with patient today and changes made to appropriate areas of the chart.   Review of Systems  Constitutional: Negative.   HENT: Negative.    Eyes: Negative.   Respiratory: Negative.    Cardiovascular: Negative.   Gastrointestinal: Negative.   Genitourinary: Negative.   Musculoskeletal: Negative.   Skin: Negative.   Neurological: Negative.   Psychiatric/Behavioral: Negative.      All other ROS negative except what is listed above and in the HPI.      Objective:    BP (!) 143/76   Pulse 77   Ht 5\' 4"  (1.626 m)   Wt 177 lb 12.8 oz (80.6 kg)   SpO2 98%   BMI 30.52 kg/m   Wt Readings from Last 3 Encounters:  12/10/22 177 lb 12.8 oz (80.6 kg)  06/09/22 178 lb 1.6 oz (80.8 kg)  12/07/21 174 lb 6.4 oz (79.1 kg)    No results found.  Physical Exam Vitals and nursing note reviewed.  Constitutional:      General: She is not in acute distress.    Appearance: Normal appearance. She is obese. She is not ill-appearing, toxic-appearing or diaphoretic.  HENT:     Head: Normocephalic and atraumatic.     Right Ear: Hearing and external ear normal.     Left Ear: Hearing and external ear normal.     Nose: Nose normal.     Mouth/Throat:     Mouth: Mucous membranes are moist.     Pharynx: Oropharynx is clear.     Tonsils: No tonsillar exudate or tonsillar abscesses.  Eyes:     General: Lids are normal. No scleral icterus.       Right eye: No discharge.        Left eye: No discharge.     Extraocular Movements: Extraocular movements intact.     Conjunctiva/sclera: Conjunctivae normal.     Pupils:  Pupils are equal, round, and reactive to light.  Neck:     Thyroid: No thyroid mass, thyromegaly or thyroid tenderness.     Trachea: Trachea normal. No tracheal deviation.  Cardiovascular:     Rate and Rhythm: Normal rate and regular rhythm.     Pulses: Normal pulses.          Radial pulses are 2+ on the right side and 2+ on the left side.       Posterior tibial pulses are 2+ on the right side and 2+ on the left side.     Heart sounds: Normal heart sounds, S1 normal and S2 normal. No murmur heard.    No friction rub. No gallop.  Pulmonary:     Effort: Pulmonary effort is normal. No respiratory distress.     Breath sounds: Normal breath sounds. No stridor. No wheezing, rhonchi or rales.  Chest:     Chest wall: No tenderness.  Abdominal:     General: Bowel sounds are normal.     Palpations: Abdomen is soft.     Tenderness: There is no abdominal  tenderness. There is no right CVA tenderness, left CVA tenderness or guarding.  Musculoskeletal:        General: Normal range of motion.     Cervical back: Full passive range of motion without pain, normal range of motion and neck supple.     Right lower leg: No edema.     Left lower leg: No edema.  Lymphadenopathy:     Cervical: No cervical adenopathy.  Skin:    General: Skin is warm and dry.     Capillary Refill: Capillary refill takes less than 2 seconds.     Coloration: Skin is not jaundiced or pale.     Findings: No bruising, erythema, lesion or rash.  Neurological:     General: No focal deficit present.     Mental Status: She is alert and oriented to person, place, and time. Mental status is at baseline.     Cranial Nerves: Cranial nerves 2-12 are intact. No cranial nerve deficit.     Sensory: No sensory deficit.     Motor: No weakness or tremor.     Coordination: Coordination is intact.     Gait: Gait is intact.     Deep Tendon Reflexes: Reflexes are normal and symmetric.  Psychiatric:        Attention and Perception: Attention  and perception normal. She does not perceive auditory or visual hallucinations.        Mood and Affect: Mood and affect normal.        Speech: Speech normal.        Behavior: Behavior normal. Behavior is cooperative.        Thought Content: Thought content normal.        Cognition and Memory: Cognition and memory normal.        Judgment: Judgment normal.         No data to display          Cognitive Testing - 6-CIT  Correct? Score   What year is it? yes 0 Yes = 0    No = 4  What month is it? yes 0 Yes = 0    No = 3  Remember:     Floyde Parkins, [address], [place]     What time is it? yes 0 Yes = 0    No = 3  Count backwards from 20 to 1 yes 0 Correct = 0    1 error = 2   More than 1 error = 4  Say the months of the year in reverse. yes 0 Correct = 0    1 error = 2   More than 1 error = 4  What address did I ask you to remember? yes 0 Correct = 0  1 error = 2    2 error = 4    3 error = 6    4 error = 8    All wrong = 10       TOTAL SCORE  0/28   Interpretation:  Normal  Normal (0-7) Abnormal (8-28)   Results for orders placed or performed in visit on 12/10/22  Microscopic Examination   Urine  Result Value Ref Range   WBC, UA 0-5 0 - 5 /hpf   RBC, Urine 3-10 (A) 0 - 2 /hpf   Epithelial Cells (non renal) 0-10 0 - 10 /hpf   Bacteria, UA None seen None seen/Few  Bayer DCA Hb A1c Waived  Result Value Ref Range  HB A1C (BAYER DCA - WAIVED) 5.3 4.8 - 5.6 %  Urinalysis, Routine w reflex microscopic  Result Value Ref Range   Specific Gravity, UA 1.020 1.005 - 1.030   pH, UA 7.5 5.0 - 7.5   Color, UA Yellow Yellow   Appearance Ur Clear Clear   Leukocytes,UA Trace (A) Negative   Protein,UA Trace (A) Negative/Trace   Glucose, UA Negative Negative   Ketones, UA Negative Negative   RBC, UA 1+ (A) Negative   Bilirubin, UA Negative Negative   Urobilinogen, Ur 1.0 0.2 - 1.0 mg/dL   Nitrite, UA Negative Negative   Microscopic Examination See below:       Assessment &  Plan:   Problem List Items Addressed This Visit     Hyperlipidemia    Chronic.  Controlled.  Continue with current medication regimen of Atorvastatin 20mg  daily.  Refills sent today.  Labs ordered today.  Return to clinic in 6 months for reevaluation.  Call sooner if concerns arise.        Relevant Medications   atorvastatin (LIPITOR) 20 MG tablet   Other Relevant Orders   Lipid Panel w/o Chol/HDL Ratio   IFG (impaired fasting glucose)    Chronic, ongoing. Recheck A1c and CMP today.       Relevant Orders   Bayer DCA Hb A1c Waived (Completed)   Urinalysis, Routine w reflex microscopic (Completed)   Other Visit Diagnoses     Annual physical exam    -  Primary   Annual routine labs done today. CBC, CMP, lipid panel, UA, and A1C.   Relevant Orders   Bayer DCA Hb A1c Waived (Completed)   Comp Met (CMET)   Urinalysis, Routine w reflex microscopic (Completed)   CBC   Lipid Panel w/o Chol/HDL Ratio   TSH   Microscopic Examination (Completed)   Screening for colon cancer       Referral sent for colonoscopy   Relevant Orders   Ambulatory referral to Gastroenterology   Screening for lung cancer       Order placed for low dose CT lung scan.   Relevant Orders   CT CHEST LUNG CANCER SCREENING LOW DOSE WO CONTRAST   Encounter for smoking cessation counseling       Chronic, ongoing. Smoking cessation provided for 5 mintues, pt declined options at this time.        Preventative Services:  AAA screening: Not due Health Risk Assessment and Personalized Prevention Plan: Bone Mass Measurements: Not due Breast Cancer Screening: 07/14/2024 CVD Screening: Yes Cervical Cancer Screening: Due 2025, last done 2021 Colon Cancer Screening: They were supposed to call and never did, was resistant since told would have to pay 2k.  Depression Screening: Yes Diabetes Screening: Done Glaucoma Screening: saw eye MD last year october Hepatitis B vaccine: Yes Hepatitis C screening: Yes HIV  Screening: yes Flu Vaccine: Completed Lung cancer Screening: She would like this intrested in this.  Obesity Screening:  Pneumonia Vaccines (2): Yes PPSV 23 STI Screening: Decline Tobacco user: 1/2 pack a day.   Follow up plan: Return in about 6 months (around 06/12/2023) for Follow up HLD.   LABORATORY TESTING:  - Pap smear:  Due 2025  IMMUNIZATIONS:   - Tdap: Tetanus vaccination status reviewed: last tetanus booster within 10 years. - Influenza: Up to date - Pneumovax: Up to date - Prevnar: Not applicable - Zostavax vaccine: Up to date  SCREENING: -Mammogram: Up to date  - Colonoscopy: Ordered today  - Bone Density:  Not applicable  -Hearing Test: Not applicable  -Spirometry: Not applicable   PATIENT COUNSELING:   Advised to take 1 mg of folate supplement per day if capable of pregnancy.   Sexuality: Discussed sexually transmitted diseases, partner selection, use of condoms, avoidance of unintended pregnancy  and contraceptive alternatives.   Advised to avoid cigarette smoking.  I discussed with the patient that most people either abstain from alcohol or drink within safe limits (<=14/week and <=4 drinks/occasion for males, <=7/weeks and <= 3 drinks/occasion for females) and that the risk for alcohol disorders and other health effects rises proportionally with the number of drinks per week and how often a drinker exceeds daily limits.  Discussed cessation/primary prevention of drug use and availability of treatment for abuse.   Diet: Encouraged to adjust caloric intake to maintain  or achieve ideal body weight, to reduce intake of dietary saturated fat and total fat, to limit sodium intake by avoiding high sodium foods and not adding table salt, and to maintain adequate dietary potassium and calcium preferably from fresh fruits, vegetables, and low-fat dairy products.    stressed the importance of regular exercise  Injury prevention: Discussed safety belts, safety helmets,  smoke detector, smoking near bedding or upholstery.   Dental health: Discussed importance of regular tooth brushing, flossing, and dental visits.    NEXT PREVENTATIVE PHYSICAL DUE IN 1 YEAR. Return in about 6 months (around 06/12/2023) for Follow up HLD.

## 2022-12-13 NOTE — Progress Notes (Signed)
Hi Trysta your electrolytes, kidney function, liver function, and thyroid levels are all normal. There are no signs of anemia. Your LDL (bad) cholesterol came back slightly elevated but lower than what it was 6 months ago, continue taking your Atorvastatin. Thank you for allowing me to participate in your care.

## 2022-12-17 ENCOUNTER — Encounter: Payer: Self-pay | Admitting: *Deleted

## 2023-03-21 DIAGNOSIS — H01009 Unspecified blepharitis unspecified eye, unspecified eyelid: Secondary | ICD-10-CM | POA: Diagnosis not present

## 2023-03-21 DIAGNOSIS — H2513 Age-related nuclear cataract, bilateral: Secondary | ICD-10-CM | POA: Diagnosis not present

## 2023-03-21 DIAGNOSIS — B880 Other acariasis: Secondary | ICD-10-CM | POA: Diagnosis not present

## 2023-03-21 DIAGNOSIS — H40011 Open angle with borderline findings, low risk, right eye: Secondary | ICD-10-CM | POA: Diagnosis not present

## 2023-06-02 ENCOUNTER — Other Ambulatory Visit: Payer: Self-pay | Admitting: Family Medicine

## 2023-06-02 DIAGNOSIS — H01009 Unspecified blepharitis unspecified eye, unspecified eyelid: Secondary | ICD-10-CM | POA: Diagnosis not present

## 2023-06-02 DIAGNOSIS — B880 Other acariasis: Secondary | ICD-10-CM | POA: Diagnosis not present

## 2023-06-02 NOTE — Telephone Encounter (Signed)
Requested by interface surescripts..future visit in 2 weeks.  Requested Prescriptions  Pending Prescriptions Disp Refills   atorvastatin (LIPITOR) 20 MG tablet [Pharmacy Med Name: ATORVASTATIN 20 MG TABLET] 90 tablet 0    Sig: TAKE 1 TABLET BY MOUTH EVERY DAY     Cardiovascular:  Antilipid - Statins Failed - 06/02/2023 12:40 PM      Failed - Lipid Panel in normal range within the last 12 months    Cholesterol, Total  Date Value Ref Range Status  12/10/2022 192 100 - 199 mg/dL Final   LDL Chol Calc (NIH)  Date Value Ref Range Status  12/10/2022 125 (H) 0 - 99 mg/dL Final   HDL  Date Value Ref Range Status  12/10/2022 43 >39 mg/dL Final   Triglycerides  Date Value Ref Range Status  12/10/2022 134 0 - 149 mg/dL Final         Passed - Patient is not pregnant      Passed - Valid encounter within last 12 months    Recent Outpatient Visits           5 months ago Annual physical exam   [place] [facility] Pearley, Sherran Needs, NP   11 months ago IFG (impaired fasting glucose)   [place] [facility] Larae Grooms, NP   1 year ago Annual physical exam   [place] [facility] Larae Grooms, NP   2 years ago IFG (impaired fasting glucose)   [place] [facility] Larae Grooms, NP   2 years ago Annual physical exam   [place] [facility] Larae Grooms, NP       Future Appointments             In 2 weeks Larae Grooms, NP [place] [facility]

## 2023-06-16 ENCOUNTER — Ambulatory Visit: Payer: BC Managed Care – PPO | Admitting: Nurse Practitioner

## 2023-06-16 ENCOUNTER — Encounter: Payer: Self-pay | Admitting: Nurse Practitioner

## 2023-06-16 VITALS — BP 155/87 | HR 71 | Ht 64.0 in | Wt 185.2 lb

## 2023-06-16 DIAGNOSIS — R030 Elevated blood-pressure reading, without diagnosis of hypertension: Secondary | ICD-10-CM | POA: Insufficient documentation

## 2023-06-16 DIAGNOSIS — R7301 Impaired fasting glucose: Secondary | ICD-10-CM | POA: Diagnosis not present

## 2023-06-16 DIAGNOSIS — E782 Mixed hyperlipidemia: Secondary | ICD-10-CM

## 2023-06-16 DIAGNOSIS — F17200 Nicotine dependence, unspecified, uncomplicated: Secondary | ICD-10-CM

## 2023-06-16 MED ORDER — ATORVASTATIN CALCIUM 20 MG PO TABS: 20.0000 mg | ORAL_TABLET | Freq: Every day | ORAL | 1 refills | Status: DC

## 2023-06-16 NOTE — Assessment & Plan Note (Signed)
Chronic.  Controlled.  Continue with current medication regimen of Atorvastatin 20mg  daily.  Refills sent today.  Labs ordered today.  Return to clinic in 6 months for reevaluation.  Call sooner if concerns arise.

## 2023-06-16 NOTE — Progress Notes (Signed)
BP (!) 155/87 (BP Location: Right Arm, Patient Position: Sitting, Cuff Size: Large)   Pulse 71   Ht 5\' 4"  (1.626 m)   Wt 185 lb 3.2 oz (84 kg)   SpO2 98%   BMI 31.79 kg/m    Subjective:    Patient ID: Tiffany Boyer, female    DOB: 15-Nov-1965, 58 y.o.   MRN: [id number]  HPI: Tiffany Boyer is a 58 y.o. female  Chief Complaint  Patient presents with   Hyperlipidemia    6 month follow up    HYPERLIPIDEMIA Hyperlipidemia status: excellent compliance Satisfied with current treatment?  no Side effects:  no Medication compliance: excellent compliance Past cholesterol meds: atorvastain (lipitor) Supplements: none Aspirin:  no The 10-year ASCVD risk score (Arnett DK, et al., 2019) is: 10.6%   Values used to calculate the score:     Age: 28 years     Sex: Female     Is Non-Hispanic African American: No     Diabetic: No     Tobacco smoker: Yes     Systolic Blood Pressure: 155 mmHg     Is BP treated: No     HDL Cholesterol: 43 mg/dL     Total Cholesterol: 192 mg/dL Chest pain:  no Coronary artery disease:  no Family history CAD:  no Family history early CAD:  no  ELEVATED BLOOD PRESSURE Has not been checking blood pressures at home.  However, it is normal for her to be elevated at office visits.    Denies HA, CP, SOB, dizziness, palpitations, visual changes, and lower extremity swelling.     Relevant past medical, surgical, family and social history reviewed and updated as indicated. Interim medical history since our last visit reviewed. Allergies and medications reviewed and updated.  Review of Systems  Eyes:  Negative for visual disturbance.  Respiratory:  Negative for cough, chest tightness and shortness of breath.   Cardiovascular:  Negative for chest pain, palpitations and leg swelling.  Neurological:  Negative for dizziness and headaches.    Per HPI unless specifically indicated above     Objective:    BP (!) 155/87 (BP Location: Right Arm, Patient  Position: Sitting, Cuff Size: Large)   Pulse 71   Ht 5\' 4"  (1.626 m)   Wt 185 lb 3.2 oz (84 kg)   SpO2 98%   BMI 31.79 kg/m   Wt Readings from Last 3 Encounters:  06/16/23 185 lb 3.2 oz (84 kg)  12/10/22 177 lb 12.8 oz (80.6 kg)  06/09/22 178 lb 1.6 oz (80.8 kg)    Physical Exam Vitals and nursing note reviewed.  Constitutional:      General: She is not in acute distress.    Appearance: Normal appearance. She is normal weight. She is not ill-appearing, toxic-appearing or diaphoretic.  HENT:     Head: Normocephalic.     Right Ear: External ear normal.     Left Ear: External ear normal.     Nose: Nose normal.     Mouth/Throat:     Mouth: Mucous membranes are moist.     Pharynx: Oropharynx is clear.  Eyes:     General:        Right eye: No discharge.        Left eye: No discharge.     Extraocular Movements: Extraocular movements intact.     Conjunctiva/sclera: Conjunctivae normal.     Pupils: Pupils are equal, round, and reactive to light.  Cardiovascular:  Rate and Rhythm: Normal rate and regular rhythm.     Heart sounds: No murmur heard. Pulmonary:     Effort: Pulmonary effort is normal. No respiratory distress.     Breath sounds: Normal breath sounds. No wheezing or rales.  Musculoskeletal:     Cervical back: Normal range of motion and neck supple.  Skin:    General: Skin is warm and dry.     Capillary Refill: Capillary refill takes less than 2 seconds.  Neurological:     General: No focal deficit present.     Mental Status: She is alert and oriented to person, place, and time. Mental status is at baseline.  Psychiatric:        Mood and Affect: Mood normal.        Behavior: Behavior normal.        Thought Content: Thought content normal.        Judgment: Judgment normal.     Results for orders placed or performed in visit on 12/10/22  Microscopic Examination   Collection Time: 12/10/22  9:31 AM   Urine  Result Value Ref Range   WBC, UA 0-5 0 - 5 /hpf    RBC, Urine 3-10 (A) 0 - 2 /hpf   Epithelial Cells (non renal) 0-10 0 - 10 /hpf   Bacteria, UA None seen None seen/Few  Bayer DCA Hb A1c Waived   Collection Time: 12/10/22  9:31 AM  Result Value Ref Range   HB A1C (BAYER DCA - WAIVED) 5.3 4.8 - 5.6 %  Urinalysis, Routine w reflex microscopic   Collection Time: 12/10/22  9:31 AM  Result Value Ref Range   Specific Gravity, UA 1.020 1.005 - 1.030   pH, UA 7.5 5.0 - 7.5   Color, UA Yellow Yellow   Appearance Ur Clear Clear   Leukocytes,UA Trace (A) Negative   Protein,UA Trace (A) Negative/Trace   Glucose, UA Negative Negative   Ketones, UA Negative Negative   RBC, UA 1+ (A) Negative   Bilirubin, UA Negative Negative   Urobilinogen, Ur 1.0 0.2 - 1.0 mg/dL   Nitrite, UA Negative Negative   Microscopic Examination See below:   Comp Met (CMET)   Collection Time: 12/10/22  9:33 AM  Result Value Ref Range   Glucose 104 (H) 70 - 99 mg/dL   BUN 12 6 - 24 mg/dL   Creatinine, Ser 1.61 0.57 - 1.00 mg/dL   eGFR 096 >04 VW/UJW/1.19   BUN/Creatinine Ratio 18 9 - 23   Sodium 141 134 - 144 mmol/L   Potassium 4.3 3.5 - 5.2 mmol/L   Chloride 106 96 - 106 mmol/L   CO2 23 20 - 29 mmol/L   Calcium 9.4 8.7 - 10.2 mg/dL   Total Protein 6.4 6.0 - 8.5 g/dL   Albumin 4.4 3.8 - 4.9 g/dL   Globulin, Total 2.0 1.5 - 4.5 g/dL   Bilirubin Total 0.5 0.0 - 1.2 mg/dL   Alkaline Phosphatase 116 44 - 121 IU/L   AST 32 0 - 40 IU/L   ALT 21 0 - 32 IU/L  CBC   Collection Time: 12/10/22  9:33 AM  Result Value Ref Range   WBC 5.3 3.4 - 10.8 x10E3/uL   RBC 4.59 3.77 - 5.28 x10E6/uL   Hemoglobin 14.0 11.1 - 15.9 g/dL   Hematocrit 14.7 82.9 - 46.6 %   MCV 91 79 - 97 fL   MCH 30.5 26.6 - 33.0 pg   MCHC 33.4 31.5 - 35.7 g/dL  RDW 13.3 11.7 - 15.4 %   Platelets 159 150 - 450 x10E3/uL  Lipid Panel w/o Chol/HDL Ratio   Collection Time: 12/10/22  9:33 AM  Result Value Ref Range   Cholesterol, Total 192 100 - 199 mg/dL   Triglycerides 161 0 - 149 mg/dL   HDL 43  >09 mg/dL   VLDL Cholesterol Cal 24 5 - 40 mg/dL   LDL Chol Calc (NIH) 604 (H) 0 - 99 mg/dL  TSH   Collection Time: 12/10/22  9:33 AM  Result Value Ref Range   TSH 1.080 0.450 - 4.500 uIU/mL      Assessment & Plan:   Problem List Items Addressed This Visit       Endocrine   IFG (impaired fasting glucose) - Primary   Last A1c was in normal range at 5.3%. Labs ordered. Will make recommendations based on lab results.       Relevant Orders   Hemoglobin A1c   Comprehensive metabolic panel     Other   Hyperlipidemia   Chronic.  Controlled.  Continue with current medication regimen of Atorvastatin 20mg  daily.  Refills sent today.  Labs ordered today.  Return to clinic in 6 months for reevaluation.  Call sooner if concerns arise.       Relevant Medications   atorvastatin (LIPITOR) 20 MG tablet   Other Relevant Orders   Lipid Panel w/o Chol/HDL Ratio   Elevated blood pressure reading   Elevated whenever she goes to doctors offices.  Agrees to check blood pressures at home.  Follow up in 6 months.  Declined returning to the office sooner.  Recommend calling if blood pressure is >140/90.      Other Visit Diagnoses       Current every day smoker       Relevant Orders   Ambulatory Referral Lung Cancer Screening [facility]        Follow up plan: Return in about 6 months (around 12/14/2023) for Physical and Fasting labs.

## 2023-06-16 NOTE — Assessment & Plan Note (Signed)
Last A1c was in normal range at 5.3%. Labs ordered. Will make recommendations based on lab results.

## 2023-06-16 NOTE — Assessment & Plan Note (Signed)
Elevated whenever she goes to doctors offices.  Agrees to check blood pressures at home.  Follow up in 6 months.  Declined returning to the office sooner.  Recommend calling if blood pressure is >140/90.

## 2023-06-17 ENCOUNTER — Encounter: Payer: Self-pay | Admitting: Nurse Practitioner

## 2023-06-17 LAB — COMPREHENSIVE METABOLIC PANEL
ALT: 22 [IU]/L (ref 0–32)
AST: 34 [IU]/L (ref 0–40)
Albumin: 4.7 g/dL (ref 3.8–4.9)
Alkaline Phosphatase: 122 [IU]/L — ABNORMAL HIGH (ref 44–121)
BUN/Creatinine Ratio: 19 (ref 9–23)
BUN: 15 mg/dL (ref 6–24)
Bilirubin Total: 0.5 mg/dL (ref 0.0–1.2)
CO2: 22 mmol/L (ref 20–29)
Calcium: 9.9 mg/dL (ref 8.7–10.2)
Chloride: 104 mmol/L (ref 96–106)
Creatinine, Ser: 0.77 mg/dL (ref 0.57–1.00)
Globulin, Total: 2.4 g/dL (ref 1.5–4.5)
Glucose: 96 mg/dL (ref 70–99)
Potassium: 4 mmol/L (ref 3.5–5.2)
Sodium: 144 mmol/L (ref 134–144)
Total Protein: 7.1 g/dL (ref 6.0–8.5)
eGFR: 89 mL/min/{1.73_m2} (ref >59)

## 2023-06-17 LAB — LIPID PANEL W/O CHOL/HDL RATIO
Cholesterol, Total: 202 mg/dL — ABNORMAL HIGH (ref 100–199)
HDL: 55 mg/dL (ref >39)
LDL Chol Calc (NIH): 127 mg/dL — ABNORMAL HIGH (ref 0–99)
Triglycerides: 111 mg/dL (ref 0–149)
VLDL Cholesterol Cal: 20 mg/dL (ref 5–40)

## 2023-06-17 LAB — HEMOGLOBIN A1C
Est. average glucose Bld gHb Est-mCnc: 108 mg/dL
Hgb A1c MFr Bld: 5.4 % (ref 4.8–5.6)

## 2023-08-22 ENCOUNTER — Other Ambulatory Visit: Payer: Self-pay | Admitting: Nurse Practitioner

## 2023-08-22 DIAGNOSIS — Z1231 Encounter for screening mammogram for malignant neoplasm of breast: Secondary | ICD-10-CM

## 2023-08-30 ENCOUNTER — Ambulatory Visit
Admission: RE | Admit: 2023-08-30 | Discharge: 2023-08-30 | Disposition: A | Discharge status: Home / Self Care | Source: Ambulatory Visit | Attending: Nurse Practitioner | Admitting: Nurse Practitioner

## 2023-08-30 DIAGNOSIS — Z1231 Encounter for screening mammogram for malignant neoplasm of breast: Secondary | ICD-10-CM | POA: Insufficient documentation

## 2023-10-05 DIAGNOSIS — D485 Neoplasm of uncertain behavior of skin: Secondary | ICD-10-CM | POA: Diagnosis not present

## 2023-10-05 DIAGNOSIS — L578 Other skin changes due to chronic exposure to nonionizing radiation: Secondary | ICD-10-CM | POA: Diagnosis not present

## 2023-10-05 DIAGNOSIS — C44311 Basal cell carcinoma of skin of nose: Secondary | ICD-10-CM | POA: Diagnosis not present

## 2023-10-05 DIAGNOSIS — Z872 Personal history of diseases of the skin and subcutaneous tissue: Secondary | ICD-10-CM | POA: Diagnosis not present

## 2023-10-05 DIAGNOSIS — Z859 Personal history of malignant neoplasm, unspecified: Secondary | ICD-10-CM | POA: Diagnosis not present

## 2023-10-05 DIAGNOSIS — Z85828 Personal history of other malignant neoplasm of skin: Secondary | ICD-10-CM | POA: Diagnosis not present

## 2023-11-10 DIAGNOSIS — C44311 Basal cell carcinoma of skin of nose: Secondary | ICD-10-CM | POA: Diagnosis not present

## 2023-12-19 ENCOUNTER — Ambulatory Visit: Payer: Self-pay | Admitting: Nurse Practitioner

## 2023-12-19 VITALS — BP 152/76 | HR 75 | Temp 97.9°F | Ht 64.0 in | Wt 194.4 lb

## 2023-12-19 DIAGNOSIS — I10 Essential (primary) hypertension: Secondary | ICD-10-CM | POA: Diagnosis not present

## 2023-12-19 DIAGNOSIS — R7301 Impaired fasting glucose: Secondary | ICD-10-CM | POA: Diagnosis not present

## 2023-12-19 DIAGNOSIS — Z23 Encounter for immunization: Secondary | ICD-10-CM

## 2023-12-19 DIAGNOSIS — Z0000 Encounter for general adult medical examination without abnormal findings: Secondary | ICD-10-CM | POA: Diagnosis not present

## 2023-12-19 DIAGNOSIS — E782 Mixed hyperlipidemia: Secondary | ICD-10-CM

## 2023-12-19 MED ORDER — OLMESARTAN MEDOXOMIL 20 MG PO TABS: 20.0000 mg | ORAL_TABLET | Freq: Every day | ORAL | 0 refills | Status: DC

## 2023-12-19 MED ORDER — ATORVASTATIN CALCIUM 20 MG PO TABS: 20.0000 mg | ORAL_TABLET | Freq: Every day | ORAL | 1 refills | Status: AC

## 2023-12-19 NOTE — Assessment & Plan Note (Signed)
 Labs ordered at visit today.  Will make recommendations based on lab results.

## 2023-12-19 NOTE — Assessment & Plan Note (Signed)
 Chronic.  Not well controlled.  Checks blood pressure at home regularly and runs 140-150/80s.  Will start Olmesartan  20mg  daily.  Side effects and benefits of medication discussed.  Labs ordered.  Follow up in 1 month. Call sooner if concerns arise.

## 2023-12-19 NOTE — Progress Notes (Signed)
 BP (!) 152/76 (BP Location: Left Arm, Cuff Size: Normal)   Pulse 75   Temp 97.9 F (36.6 C) (Oral)   Ht 5' 4 (1.626 m)   Wt 194 lb 6.4 oz (88.2 kg)   SpO2 98%   BMI 33.37 kg/m    Subjective:    Patient ID: Tiffany Boyer, female    DOB: 03/16/1966, 58 y.o.   MRN: [id number]  HPI: Tiffany Boyer is a 58 y.o. female presenting on 12/19/2023 for comprehensive medical examination. Current medical complaints include:none  She currently lives with: Menopausal Symptoms: no  HYPERTENSION without Chronic Kidney Disease Hypertension status: uncontrolled  Satisfied with current treatment? no Duration of hypertension: years BP monitoring frequency:  daily BP range: 140-150/80 BP medication side effects:  no Medication compliance: excellent compliance Previous BP meds:olmesartan  (benicar ) Aspirin: no Recurrent headaches: no Visual changes: no Palpitations: no Dyspnea: no Chest pain: no Lower extremity edema: no Dizzy/lightheaded: no   HYPERLIPIDEMIA Hyperlipidemia status: excellent compliance Satisfied with current treatment?  no Side effects:  no Medication compliance: excellent compliance Past cholesterol meds: atorvastain (lipitor) Supplements: none Aspirin:  no The 10-year ASCVD risk score (Arnett DK, et al., 2019) is: 12.9%   Values used to calculate the score:     Age: 34 years     Clincally relevant sex: Female     Is Non-Hispanic African American: No     Diabetic: No     Tobacco smoker: Yes     Systolic Blood Pressure: 152 mmHg     Is BP treated: Yes     HDL Cholesterol: 55 mg/dL     Total Cholesterol: 202 mg/dL Chest pain:  no Coronary artery disease:  no Family history CAD:  no Family history early CAD:  no   Depression Screen done today and results listed below:     12/19/2023    8:05 AM 06/16/2023    8:10 AM 12/10/2022    8:35 AM 06/09/2022    9:04 AM 12/07/2021    9:24 AM  Depression screen PHQ 2/9  Decreased Interest 2 0 0 1 3  Down, Depressed,  Hopeless 1 0 0 0 1  PHQ - 2 Score 3 0 0 1 4  Altered sleeping 1 0 1 1 2   Tired, decreased energy 2 0 1 0 1  Change in appetite 2 2 1 1 1   Feeling bad or failure about yourself  0 1 0 0 0  Trouble concentrating 1 0 0 0 0  Moving slowly or fidgety/restless 0 0 0 0 0  Suicidal thoughts 0 0 0 0 0  PHQ-9 Score 9 3 3 3 8   Difficult doing work/chores Somewhat difficult  Not difficult at all Not difficult at all Not difficult at all    The patient does not have a history of falls. I did complete a risk assessment for falls. A plan of care for falls was documented.   Past Medical History:  Past Medical History:  Diagnosis Date   Anxiety    Hyperlipidemia    Hypertension     Surgical History:  Past Surgical History:  Procedure Laterality Date   CESAREAN SECTION     COLONOSCOPY WITH PROPOFOL  N/A 09/04/2019   Procedure: COLONOSCOPY WITH PROPOFOL ;  Surgeon: Therisa Bi, MD;  Location: [facility] ENDOSCOPY;  Service: Gastroenterology;  Laterality: N/A;   TONSILLECTOMY     TUBAL LIGATION      Medications:  Current Outpatient Medications on File Prior to Visit  Medication Sig  Multiple Vitamins-Minerals (WOMENS 50+ MULTI VITAMIN/MIN PO) Take by mouth daily.   Turmeric (QC TUMERIC COMPLEX PO) Take by mouth.   No current facility-administered medications on file prior to visit.    Allergies:  No Known Allergies  Social History:  Social History   Socioeconomic History   Marital status: Married    Spouse name: Not on file   Number of children: Not on file   Years of education: Not on file   Highest education level: Associate degree: occupational, Scientist, product/process development, or vocational program  Occupational History   Not on file  Tobacco Use   Smoking status: Every Day    Current packs/day: 0.50    Average packs/day: 0.5 packs/day for 42.2 years (21.1 ttl pk-yrs)    Types: Cigarettes    Start date: 11/27/1981   Smokeless tobacco: Never  Vaping Use   Vaping status: Never Used  Substance and  Sexual Activity   Alcohol use: No   Drug use: No   Sexual activity: Yes    Birth control/protection: Post-menopausal  Other Topics Concern   Not on file  Social History Narrative   Not on file   Social Drivers of Health   Financial Resource Strain: Low Risk  (12/19/2023)   Overall Financial Resource Strain (CARDIA)    Difficulty of Paying Living Expenses: Not hard at all  Food Insecurity: No Food Insecurity (12/19/2023)   Hunger Vital Sign    Worried About Running Out of Food in the Last Year: Never true    Ran Out of Food in the Last Year: Never true  Transportation Needs: No Transportation Needs (12/19/2023)   PRAPARE - Administrator, Civil Service (Medical): No    Lack of Transportation (Non-Medical): No  Physical Activity: Inactive (12/19/2023)   Exercise Vital Sign    Days of Exercise per Week: 0 days    Minutes of Exercise per Session: Not on file  Stress: Stress Concern Present (12/19/2023)   [employer] of Occupational Health - Occupational Stress Questionnaire    Feeling of Stress: Very much  Social Connections: Socially Isolated (12/19/2023)   Social Connection and Isolation Panel    Frequency of Communication with Friends and Family: Once a week    Frequency of Social Gatherings with Friends and Family: Once a week    Attends Religious Services: Never    Database administrator or Organizations: No    Attends Engineer, structural: Not on file    Marital Status: Married  Catering manager Violence: Not At Risk (12/19/2023)   Humiliation, Afraid, Rape, and Kick questionnaire    Fear of Current or Ex-Partner: No    Emotionally Abused: No    Physically Abused: No    Sexually Abused: No   Social History   Tobacco Use  Smoking Status Every Day   Current packs/day: 0.50   Average packs/day: 0.5 packs/day for 42.2 years (21.1 ttl pk-yrs)   Types: Cigarettes   Start date: 11/27/1981  Smokeless Tobacco Never   Social History   Substance and Sexual  Activity  Alcohol Use No    Family History:  Family History  Problem Relation Age of Onset   Hypertension Mother    Stroke Brother    Cancer Brother    Breast cancer Paternal Aunt     Past medical history, surgical history, medications, allergies, family history and social history reviewed with patient today and changes made to appropriate areas of the chart.   Review of Systems  Eyes:  Negative for blurred vision and double vision.  Respiratory:  Negative for shortness of breath.   Cardiovascular:  Negative for chest pain, palpitations and leg swelling.  Neurological:  Negative for dizziness and headaches.   All other ROS negative except what is listed above and in the HPI.      Objective:    BP (!) 152/76 (BP Location: Left Arm, Cuff Size: Normal)   Pulse 75   Temp 97.9 F (36.6 C) (Oral)   Ht 5' 4 (1.626 m)   Wt 194 lb 6.4 oz (88.2 kg)   SpO2 98%   BMI 33.37 kg/m   Wt Readings from Last 3 Encounters:  12/19/23 194 lb 6.4 oz (88.2 kg)  06/16/23 185 lb 3.2 oz (84 kg)  12/10/22 177 lb 12.8 oz (80.6 kg)    Physical Exam Vitals and nursing note reviewed.  Constitutional:      General: She is awake. She is not in acute distress.    Appearance: Normal appearance. She is well-developed. She is not ill-appearing.  HENT:     Head: Normocephalic and atraumatic.     Right Ear: Hearing, tympanic membrane, ear canal and external ear normal. No drainage.     Left Ear: Hearing, tympanic membrane, ear canal and external ear normal. No drainage.     Nose: Nose normal.     Right Sinus: No maxillary sinus tenderness or frontal sinus tenderness.     Left Sinus: No maxillary sinus tenderness or frontal sinus tenderness.     Mouth/Throat:     Mouth: Mucous membranes are moist.     Pharynx: Oropharynx is clear. Uvula midline. No pharyngeal swelling, oropharyngeal exudate or posterior oropharyngeal erythema.  Eyes:     General: Lids are normal.        Right eye: No discharge.         Left eye: No discharge.     Extraocular Movements: Extraocular movements intact.     Conjunctiva/sclera: Conjunctivae normal.     Pupils: Pupils are equal, round, and reactive to light.     Visual Fields: Right eye visual fields normal and left eye visual fields normal.  Neck:     Thyroid : No thyromegaly.     Vascular: No carotid bruit.     Trachea: Trachea normal.  Cardiovascular:     Rate and Rhythm: Normal rate and regular rhythm.     Heart sounds: Normal heart sounds. No murmur heard.    No gallop.  Pulmonary:     Effort: Pulmonary effort is normal. No accessory muscle usage or respiratory distress.     Breath sounds: Normal breath sounds.  Chest:  Breasts:    Right: Normal.     Left: Normal.  Abdominal:     General: Bowel sounds are normal.     Palpations: Abdomen is soft. There is no hepatomegaly or splenomegaly.     Tenderness: There is no abdominal tenderness.  Musculoskeletal:        General: Normal range of motion.     Cervical back: Normal range of motion and neck supple.     Right lower leg: No edema.     Left lower leg: No edema.  Lymphadenopathy:     Head:     Right side of head: No submental, submandibular, tonsillar, preauricular or posterior auricular adenopathy.     Left side of head: No submental, submandibular, tonsillar, preauricular or posterior auricular adenopathy.     Cervical: No cervical adenopathy.  Upper Body:     Right upper body: No supraclavicular, axillary or pectoral adenopathy.     Left upper body: No supraclavicular, axillary or pectoral adenopathy.  Skin:    General: Skin is warm and dry.     Capillary Refill: Capillary refill takes less than 2 seconds.     Findings: No rash.  Neurological:     Mental Status: She is alert and oriented to person, place, and time.     Gait: Gait is intact.  Psychiatric:        Attention and Perception: Attention normal.        Mood and Affect: Mood normal.        Speech: Speech normal.         Behavior: Behavior normal. Behavior is cooperative.        Thought Content: Thought content normal.        Judgment: Judgment normal.     Results for orders placed or performed in visit on 06/16/23  Hemoglobin A1c   Collection Time: 06/16/23  8:39 AM  Result Value Ref Range   Hgb A1c MFr Bld 5.4 4.8 - 5.6 %   Est. average glucose Bld gHb Est-mCnc 108 mg/dL  Comprehensive metabolic panel   Collection Time: 06/16/23  8:39 AM  Result Value Ref Range   Glucose 96 70 - 99 mg/dL   BUN 15 6 - 24 mg/dL   Creatinine, Ser 9.22 0.57 - 1.00 mg/dL   eGFR 89 >40 fO/fpw/8.26   BUN/Creatinine Ratio 19 9 - 23   Sodium 144 134 - 144 mmol/L   Potassium 4.0 3.5 - 5.2 mmol/L   Chloride 104 96 - 106 mmol/L   CO2 22 20 - 29 mmol/L   Calcium  9.9 8.7 - 10.2 mg/dL   Total Protein 7.1 6.0 - 8.5 g/dL   Albumin 4.7 3.8 - 4.9 g/dL   Globulin, Total 2.4 1.5 - 4.5 g/dL   Bilirubin Total 0.5 0.0 - 1.2 mg/dL   Alkaline Phosphatase 122 (H) 44 - 121 IU/L   AST 34 0 - 40 IU/L   ALT 22 0 - 32 IU/L  Lipid Panel w/o Chol/HDL Ratio   Collection Time: 06/16/23  8:39 AM  Result Value Ref Range   Cholesterol, Total 202 (H) 100 - 199 mg/dL   Triglycerides 888 0 - 149 mg/dL   HDL 55 >60 mg/dL   VLDL Cholesterol Cal 20 5 - 40 mg/dL   LDL Chol Calc (NIH) 872 (H) 0 - 99 mg/dL      Assessment & Plan:   Problem List Items Addressed This Visit       Cardiovascular and Mediastinum   Primary hypertension   Chronic.  Not well controlled.  Checks blood pressure at home regularly and runs 140-150/80s.  Will start Olmesartan  20mg  daily.  Side effects and benefits of medication discussed.  Labs ordered.  Follow up in 1 month. Call sooner if concerns arise.       Relevant Medications   olmesartan  (BENICAR ) 20 MG tablet   atorvastatin  (LIPITOR) 20 MG tablet     Endocrine   IFG (impaired fasting glucose)   Labs ordered at visit today.  Will make recommendations based on lab results.        Relevant Orders    Hemoglobin A1c     Other   Hyperlipidemia   Chronic.  Controlled.  Continue with current medication regimen of atorvastatin  73m.  Refills sent today.  Labs ordered today.  Return to clinic  in 6 months for reevaluation.  Call sooner if concerns arise.        Relevant Medications   olmesartan  (BENICAR ) 20 MG tablet   atorvastatin  (LIPITOR) 20 MG tablet   Other Relevant Orders   Lipid panel   Other Visit Diagnoses       Annual physical exam    -  Primary   Health maintenance reviewed during visit today. Labs ordered.  Vaccines reviewed.  Declined Colon cancer screening and Lung screening.   Relevant Orders   CBC with Differential/Platelet   Comprehensive metabolic panel with GFR   Lipid panel   TSH     Need for vaccination against Streptococcus pneumoniae       Relevant Orders   Pneumococcal conjugate vaccine 20-valent (Prevnar 20)        Follow up plan: Return in about 1 month (around 01/19/2024) for BP Check.   LABORATORY TESTING:  - Pap smear: not applicable  IMMUNIZATIONS:   - Tdap: Tetanus vaccination status reviewed: last tetanus booster within 10 years. - Influenza: Postponed to flu season - Pneumovax: Up to date - Prevnar: Up to date - COVID: Not applicable - HPV: Not applicable - Shingrix vaccine: Up to date  SCREENING: -Mammogram: Up to date  - Colonoscopy: Refused  - Bone Density: Not applicable  -Hearing Test: Not applicable  -Spirometry: Not applicable   PATIENT COUNSELING:   Advised to take 1 mg of folate supplement per day if capable of pregnancy.   Sexuality: Discussed sexually transmitted diseases, partner selection, use of condoms, avoidance of unintended pregnancy  and contraceptive alternatives.   Advised to avoid cigarette smoking.  I discussed with the patient that most people either abstain from alcohol or drink within safe limits (<=14/week and <=4 drinks/occasion for males, <=7/weeks and <= 3 drinks/occasion for females) and that the  risk for alcohol disorders and other health effects rises proportionally with the number of drinks per week and how often a drinker exceeds daily limits.  Discussed cessation/primary prevention of drug use and availability of treatment for abuse.   Diet: Encouraged to adjust caloric intake to maintain  or achieve ideal body weight, to reduce intake of dietary saturated fat and total fat, to limit sodium intake by avoiding high sodium foods and not adding table salt, and to maintain adequate dietary potassium and calcium  preferably from fresh fruits, vegetables, and low-fat dairy products.    stressed the importance of regular exercise  Injury prevention: Discussed safety belts, safety helmets, smoke detector, smoking near bedding or upholstery.   Dental health: Discussed importance of regular tooth brushing, flossing, and dental visits.    NEXT PREVENTATIVE PHYSICAL DUE IN 1 YEAR. Return in about 1 month (around 01/19/2024) for BP Check.

## 2023-12-19 NOTE — Progress Notes (Deleted)
 There were no vitals taken for this visit.   Subjective:    Patient ID: Tiffany Boyer, female    DOB: 1965-05-21, 58 y.o.   MRN: [id number]  HPI: Tiffany Boyer is a 58 y.o. female presenting on 12/19/2023 for comprehensive medical examination. Current medical complaints include:none  She currently lives with: Spouse Menopausal Symptoms: no   HYPERLIPIDEMIA She is taking Atorvastatin  20 mg daily. She is exercising 5 days weekly for 30 minutes. Her diet consist of fruits, vegetables, dairy, and protein. She is drinking 80 oz of water daily and x2 cups of coffee. Hyperlipidemia status: excellent compliance Satisfied with current treatment?  yes Side effects:  no Medication compliance: excellent compliance Past cholesterol meds: none Supplements: fish oil  Aspirin:  no The 10-year ASCVD risk score (Arnett DK, et al., 2019) is: 9.9%   Values used to calculate the score:     Age: 72 years     Clincally relevant sex: Female     Is Non-Hispanic African American: No     Diabetic: No     Tobacco smoker: Yes     Systolic Blood Pressure: 155 mmHg     Is BP treated: No     HDL Cholesterol: 55 mg/dL     Total Cholesterol: 202 mg/dL Chest pain:  no Coronary artery disease:  yes Family history CAD:  yes Family history early CAD:  yes   Functional Status Survey:       12/10/2022    8:35 AM 06/09/2022    9:04 AM 12/07/2021    9:24 AM 05/06/2021    8:38 AM 11/04/2020    8:53 AM  Fall Risk   Falls in the past year? 0 0 0 0 1  Number falls in past yr: 0 0 0 0 0  Injury with Fall? 0 0 0 0 0  Risk for fall due to : No Fall Risks No Fall Risks No Fall Risks No Fall Risks No Fall Risks  Follow up Falls evaluation completed Falls evaluation completed  Falls evaluation completed  Falls evaluation completed  Falls evaluation completed      Data saved with a previous flowsheet row definition    Depression Screen    06/16/2023    8:10 AM 12/10/2022    8:35 AM 06/09/2022    9:04 AM  12/07/2021    9:24 AM 05/06/2021    8:38 AM  Depression screen PHQ 2/9  Decreased Interest 0 0 1 3 0  Down, Depressed, Hopeless 0 0 0 1 0  PHQ - 2 Score 0 0 1 4 0  Altered sleeping 0 1 1 2 2   Tired, decreased energy 0 1 0 1 0  Change in appetite 2 1 1 1  0  Feeling bad or failure about yourself  1 0 0 0 0  Trouble concentrating 0 0 0 0 0  Moving slowly or fidgety/restless 0 0 0 0 0  Suicidal thoughts 0 0 0 0 0  PHQ-9 Score 3 3 3 8 2   Difficult doing work/chores  Not difficult at all Not difficult at all Not difficult at all Not difficult at all      06/16/2023    8:10 AM 12/10/2022    8:35 AM 06/09/2022    9:04 AM 12/07/2021    9:24 AM  GAD 7 : Generalized Anxiety Score  Nervous, Anxious, on Edge 3 1 1 1   Control/stop worrying 3 2 2 1   Worry too much - different things 1 1  1 1  Trouble relaxing 0 1 1 1   Restless 0 1 0 0  Easily annoyed or irritable 1 1 1  0  Afraid - awful might happen 1 0 0 0  Total GAD 7 Score 9 7 6 4   Anxiety Difficulty  Not difficult at all Not difficult at all Not difficult at all    Advanced Directives Does patient have a HCPOA?    no If yes, name and contact information:  Does patient have a living will or MOST form?  no  Past Medical History:  Past Medical History:  Diagnosis Date   Hyperlipidemia     Surgical History:  Past Surgical History:  Procedure Laterality Date   CESAREAN SECTION     COLONOSCOPY WITH PROPOFOL  N/A 09/04/2019   Procedure: COLONOSCOPY WITH PROPOFOL ;  Surgeon: Therisa Bi, MD;  Location: [facility] ENDOSCOPY;  Service: Gastroenterology;  Laterality: N/A;   TONSILLECTOMY      Medications:  Current Outpatient Medications on File Prior to Visit  Medication Sig   atorvastatin  (LIPITOR) 20 MG tablet Take 1 tablet (20 mg total) by mouth daily.   Multiple Vitamins-Minerals (WOMENS 50+ MULTI VITAMIN/MIN PO) Take by mouth daily.   Turmeric (QC TUMERIC COMPLEX PO) Take by mouth.   No current facility-administered medications on file  prior to visit.    Allergies:  No Known Allergies  Social History:  Social History   Socioeconomic History   Marital status: Married    Spouse name: Not on file   Number of children: Not on file   Years of education: Not on file   Highest education level: Associate degree: occupational, Scientist, product/process development, or vocational program  Occupational History   Not on file  Tobacco Use   Smoking status: Every Day    Current packs/day: 0.50    Average packs/day: 0.5 packs/day for 42.1 years (21.0 ttl pk-yrs)    Types: Cigarettes    Start date: 11/27/1981   Smokeless tobacco: Never  Vaping Use   Vaping status: Never Used  Substance and Sexual Activity   Alcohol use: No   Drug use: No   Sexual activity: Yes  Other Topics Concern   Not on file  Social History Narrative   Not on file   Social Drivers of Health   Financial Resource Strain: Low Risk  (12/19/2023)   Overall Financial Resource Strain (CARDIA)    Difficulty of Paying Living Expenses: Not hard at all  Food Insecurity: No Food Insecurity (12/19/2023)   Hunger Vital Sign    Worried About Running Out of Food in the Last Year: Never true    Ran Out of Food in the Last Year: Never true  Transportation Needs: No Transportation Needs (12/19/2023)   PRAPARE - Administrator, Civil Service (Medical): No    Lack of Transportation (Non-Medical): No  Physical Activity: Inactive (12/19/2023)   Exercise Vital Sign    Days of Exercise per Week: 0 days    Minutes of Exercise per Session: Not on file  Stress: Stress Concern Present (12/19/2023)   [employer] of Occupational Health - Occupational Stress Questionnaire    Feeling of Stress: Very much  Social Connections: Socially Isolated (12/19/2023)   Social Connection and Isolation Panel    Frequency of Communication with Friends and Family: Once a week    Frequency of Social Gatherings with Friends and Family: Once a week    Attends Religious Services: Never    Doctor, general practice or Organizations: No  Attends Banker Meetings: Not on file    Marital Status: Married  Intimate Partner Violence: Not At Risk (12/10/2022)   Humiliation, Afraid, Rape, and Kick questionnaire    Fear of Current or Ex-Partner: No    Emotionally Abused: No    Physically Abused: No    Sexually Abused: No   Social History   Tobacco Use  Smoking Status Every Day   Current packs/day: 0.50   Average packs/day: 0.5 packs/day for 42.1 years (21.0 ttl pk-yrs)   Types: Cigarettes   Start date: 11/27/1981  Smokeless Tobacco Never   Social History   Substance and Sexual Activity  Alcohol Use No    Family History:  Family History  Problem Relation Age of Onset   Hypertension Mother    Stroke Brother    Breast cancer Paternal Aunt     Past medical history, surgical history, medications, allergies, family history and social history reviewed with patient today and changes made to appropriate areas of the chart.   Review of Systems  Constitutional: Negative.   HENT: Negative.    Eyes: Negative.   Respiratory: Negative.    Cardiovascular: Negative.   Gastrointestinal: Negative.   Genitourinary: Negative.   Musculoskeletal: Negative.   Skin: Negative.   Neurological: Negative.   Psychiatric/Behavioral: Negative.      All other ROS negative except what is listed above and in the HPI.      Objective:    There were no vitals taken for this visit.  Wt Readings from Last 3 Encounters:  06/16/23 185 lb 3.2 oz (84 kg)  12/10/22 177 lb 12.8 oz (80.6 kg)  06/09/22 178 lb 1.6 oz (80.8 kg)    No results found.  Physical Exam Vitals and nursing note reviewed.  Constitutional:      General: She is not in acute distress.    Appearance: Normal appearance. She is obese. She is not ill-appearing, toxic-appearing or diaphoretic.  HENT:     Head: Normocephalic and atraumatic.     Right Ear: Hearing and external ear normal.     Left Ear: Hearing and external ear  normal.     Nose: Nose normal.     Mouth/Throat:     Mouth: Mucous membranes are moist.     Pharynx: Oropharynx is clear.     Tonsils: No tonsillar exudate or tonsillar abscesses.  Eyes:     General: Lids are normal. No scleral icterus.       Right eye: No discharge.        Left eye: No discharge.     Extraocular Movements: Extraocular movements intact.     Conjunctiva/sclera: Conjunctivae normal.     Pupils: Pupils are equal, round, and reactive to light.  Neck:     Thyroid : No thyroid  mass, thyromegaly or thyroid  tenderness.     Trachea: Trachea normal. No tracheal deviation.  Cardiovascular:     Rate and Rhythm: Normal rate and regular rhythm.     Pulses: Normal pulses.          Radial pulses are 2+ on the right side and 2+ on the left side.       Posterior tibial pulses are 2+ on the right side and 2+ on the left side.     Heart sounds: Normal heart sounds, S1 normal and S2 normal. No murmur heard.    No friction rub. No gallop.  Pulmonary:     Effort: Pulmonary effort is normal. No respiratory distress.  Breath sounds: Normal breath sounds. No stridor. No wheezing, rhonchi or rales.  Chest:     Chest wall: No tenderness.  Abdominal:     General: Bowel sounds are normal.     Palpations: Abdomen is soft.     Tenderness: There is no abdominal tenderness. There is no right CVA tenderness, left CVA tenderness or guarding.  Musculoskeletal:        General: Normal range of motion.     Cervical back: Full passive range of motion without pain, normal range of motion and neck supple.     Right lower leg: No edema.     Left lower leg: No edema.  Lymphadenopathy:     Cervical: No cervical adenopathy.  Skin:    General: Skin is warm and dry.     Capillary Refill: Capillary refill takes less than 2 seconds.     Coloration: Skin is not jaundiced or pale.     Findings: No bruising, erythema, lesion or rash.  Neurological:     General: No focal deficit present.     Mental Status:  She is alert and oriented to person, place, and time. Mental status is at baseline.     Cranial Nerves: Cranial nerves 2-12 are intact. No cranial nerve deficit.     Sensory: No sensory deficit.     Motor: No weakness or tremor.     Coordination: Coordination is intact.     Gait: Gait is intact.     Deep Tendon Reflexes: Reflexes are normal and symmetric.  Psychiatric:        Attention and Perception: Attention and perception normal. She does not perceive auditory or visual hallucinations.        Mood and Affect: Mood and affect normal.        Speech: Speech normal.        Behavior: Behavior normal. Behavior is cooperative.        Thought Content: Thought content normal.        Cognition and Memory: Cognition and memory normal.        Judgment: Judgment normal.         No data to display          Cognitive Testing - 6-CIT  Correct? Score   What year is it? yes 0 Yes = 0    No = 4  What month is it? yes 0 Yes = 0    No = 3  Remember:     Norleen Sharps, [address], [place]     What time is it? yes 0 Yes = 0    No = 3  Count backwards from 20 to 1 yes 0 Correct = 0    1 error = 2   More than 1 error = 4  Say the months of the year in reverse. yes 0 Correct = 0    1 error = 2   More than 1 error = 4  What address did I ask you to remember? yes 0 Correct = 0  1 error = 2    2 error = 4    3 error = 6    4 error = 8    All wrong = 10       TOTAL SCORE  0/28   Interpretation:  Normal  Normal (0-7) Abnormal (8-28)   Results for orders placed or performed in visit on 06/16/23  Hemoglobin A1c   Collection Time: 06/16/23  8:39 AM  Result Value Ref Range   Hgb A1c MFr Bld 5.4 4.8 - 5.6 %   Est. average glucose Bld gHb Est-mCnc 108 mg/dL  Comprehensive metabolic panel   Collection Time: 06/16/23  8:39 AM  Result Value Ref Range   Glucose 96 70 - 99 mg/dL   BUN 15 6 - 24 mg/dL   Creatinine, Ser 9.22 0.57 - 1.00 mg/dL   eGFR 89 >40 fO/fpw/8.26   BUN/Creatinine Ratio 19 9 - 23    Sodium 144 134 - 144 mmol/L   Potassium 4.0 3.5 - 5.2 mmol/L   Chloride 104 96 - 106 mmol/L   CO2 22 20 - 29 mmol/L   Calcium  9.9 8.7 - 10.2 mg/dL   Total Protein 7.1 6.0 - 8.5 g/dL   Albumin 4.7 3.8 - 4.9 g/dL   Globulin, Total 2.4 1.5 - 4.5 g/dL   Bilirubin Total 0.5 0.0 - 1.2 mg/dL   Alkaline Phosphatase 122 (H) 44 - 121 IU/L   AST 34 0 - 40 IU/L   ALT 22 0 - 32 IU/L  Lipid Panel w/o Chol/HDL Ratio   Collection Time: 06/16/23  8:39 AM  Result Value Ref Range   Cholesterol, Total 202 (H) 100 - 199 mg/dL   Triglycerides 888 0 - 149 mg/dL   HDL 55 >60 mg/dL   VLDL Cholesterol Cal 20 5 - 40 mg/dL   LDL Chol Calc (NIH) 872 (H) 0 - 99 mg/dL      Assessment & Plan:   Problem List Items Addressed This Visit   None     Preventative Services:  AAA screening: Not due Health Risk Assessment and Personalized Prevention Plan: Bone Mass Measurements: Not due Breast Cancer Screening: 07/14/2024 CVD Screening: Yes Cervical Cancer Screening: Due 2025, last done 2021 Colon Cancer Screening: They were supposed to call and never did, was resistant since told would have to pay 2k.  Depression Screening: Yes Diabetes Screening: Done Glaucoma Screening: saw eye MD last year october Hepatitis B vaccine: Yes Hepatitis C screening: Yes HIV Screening: yes Flu Vaccine: Completed Lung cancer Screening: She would like this intrested in this.  Obesity Screening:  Pneumonia Vaccines (2): Yes PPSV 23 STI Screening: Decline Tobacco user: 1/2 pack a day.   Follow up plan: No follow-ups on file.   LABORATORY TESTING:  - Pap smear: Due 2025  IMMUNIZATIONS:   - Tdap: Tetanus vaccination status reviewed: last tetanus booster within 10 years. - Influenza: Up to date - Pneumovax: Up to date - Prevnar: Not applicable - Zostavax vaccine: Up to date  SCREENING: -Mammogram: Up to date  - Colonoscopy: Ordered today  - Bone Density: Not applicable  -Hearing Test: Not applicable   -Spirometry: Not applicable   PATIENT COUNSELING:   Advised to take 1 mg of folate supplement per day if capable of pregnancy.   Sexuality: Discussed sexually transmitted diseases, partner selection, use of condoms, avoidance of unintended pregnancy  and contraceptive alternatives.   Advised to avoid cigarette smoking.  I discussed with the patient that most people either abstain from alcohol or drink within safe limits (<=14/week and <=4 drinks/occasion for males, <=7/weeks and <= 3 drinks/occasion for females) and that the risk for alcohol disorders and other health effects rises proportionally with the number of drinks per week and how often a drinker exceeds daily limits.  Discussed cessation/primary prevention of drug use and availability of treatment for abuse.   Diet: Encouraged to adjust caloric intake to maintain  or achieve ideal body weight,  to reduce intake of dietary saturated fat and total fat, to limit sodium intake by avoiding high sodium foods and not adding table salt, and to maintain adequate dietary potassium and calcium  preferably from fresh fruits, vegetables, and low-fat dairy products.    stressed the importance of regular exercise  Injury prevention: Discussed safety belts, safety helmets, smoke detector, smoking near bedding or upholstery.   Dental health: Discussed importance of regular tooth brushing, flossing, and dental visits.    NEXT PREVENTATIVE PHYSICAL DUE IN 1 YEAR. No follow-ups on file.

## 2023-12-19 NOTE — Assessment & Plan Note (Signed)
 Chronic.  Controlled.  Continue with current medication regimen of atorvastatin  40m.  Refills sent today.  Labs ordered today.  Return to clinic in 6 months for reevaluation.  Call sooner if concerns arise.

## 2023-12-20 ENCOUNTER — Ambulatory Visit: Payer: Self-pay | Admitting: Nurse Practitioner

## 2023-12-20 LAB — CBC WITH DIFFERENTIAL/PLATELET
Basophils Absolute: 0 x10E3/uL (ref 0.0–0.2)
Basos: 1 %
EOS (ABSOLUTE): 0.2 x10E3/uL (ref 0.0–0.4)
Eos: 3 %
Hematocrit: 43.6 % (ref 34.0–46.6)
Hemoglobin: 14 g/dL (ref 11.1–15.9)
Immature Grans (Abs): 0 x10E3/uL (ref 0.0–0.1)
Immature Granulocytes: 0 %
Lymphocytes Absolute: 1.7 x10E3/uL (ref 0.7–3.1)
Lymphs: 27 %
MCH: 30.2 pg (ref 26.6–33.0)
MCHC: 32.1 g/dL (ref 31.5–35.7)
MCV: 94 fL (ref 79–97)
Monocytes Absolute: 0.3 x10E3/uL (ref 0.1–0.9)
Monocytes: 5 %
Neutrophils Absolute: 4.1 x10E3/uL (ref 1.4–7.0)
Neutrophils: 64 %
Platelets: 181 x10E3/uL (ref 150–450)
RBC: 4.63 x10E6/uL (ref 3.77–5.28)
RDW: 13.2 % (ref 11.7–15.4)
WBC: 6.2 x10E3/uL (ref 3.4–10.8)

## 2023-12-20 LAB — LIPID PANEL
Chol/HDL Ratio: 3.5 ratio (ref 0.0–4.4)
Cholesterol, Total: 183 mg/dL (ref 100–199)
HDL: 53 mg/dL (ref >39)
LDL Chol Calc (NIH): 104 mg/dL — ABNORMAL HIGH (ref 0–99)
Triglycerides: 146 mg/dL (ref 0–149)
VLDL Cholesterol Cal: 26 mg/dL (ref 5–40)

## 2023-12-20 LAB — COMPREHENSIVE METABOLIC PANEL WITH GFR
ALT: 20 IU/L (ref 0–32)
AST: 28 IU/L (ref 0–40)
Albumin: 4.4 g/dL (ref 3.8–4.9)
Alkaline Phosphatase: 119 IU/L (ref 44–121)
BUN/Creatinine Ratio: 18 (ref 12–28)
BUN: 11 mg/dL (ref 8–27)
Bilirubin Total: 0.4 mg/dL (ref 0.0–1.2)
CO2: 21 mmol/L (ref 20–29)
Calcium: 9.3 mg/dL (ref 8.7–10.3)
Chloride: 106 mmol/L (ref 96–106)
Creatinine, Ser: 0.6 mg/dL (ref 0.57–1.00)
Globulin, Total: 2 g/dL (ref 1.5–4.5)
Glucose: 107 mg/dL — ABNORMAL HIGH (ref 70–99)
Potassium: 4.4 mmol/L (ref 3.5–5.2)
Sodium: 141 mmol/L (ref 134–144)
Total Protein: 6.4 g/dL (ref 6.0–8.5)
eGFR: 103 mL/min/1.73 (ref >59)

## 2023-12-20 LAB — TSH: TSH: 1.35 u[IU]/mL (ref 0.450–4.500)

## 2023-12-20 LAB — HEMOGLOBIN A1C
Est. average glucose Bld gHb Est-mCnc: 108 mg/dL
Hgb A1c MFr Bld: 5.4 % (ref 4.8–5.6)

## 2024-01-26 ENCOUNTER — Encounter: Payer: Self-pay | Admitting: Nurse Practitioner

## 2024-01-26 ENCOUNTER — Ambulatory Visit: Admitting: Nurse Practitioner

## 2024-01-26 VITALS — BP 130/80 | HR 76 | Temp 99.5°F | Ht 64.0 in | Wt 199.2 lb

## 2024-01-26 DIAGNOSIS — I10 Essential (primary) hypertension: Secondary | ICD-10-CM | POA: Diagnosis not present

## 2024-01-26 NOTE — Progress Notes (Signed)
 BP 130/80   Pulse 76   Temp 99.5 F (37.5 C) (Oral)   Ht 5' 4 (1.626 m)   Wt 199 lb 3.2 oz (90.4 kg)   SpO2 95%   BMI 34.19 kg/m    Subjective:    Patient ID: Tiffany Boyer, female    DOB: 1965-08-25, 58 y.o.   MRN: [id number]  HPI: Tiffany Boyer is a 58 y.o. 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 no Duration of hypertension: years BP monitoring frequency:  daily BP range: 130/80 BP medication side effects:  no Medication compliance: excellent compliance Previous BP meds:olmesartan  (benicar ) Aspirin: no Recurrent headaches: no Visual changes: no Palpitations: no Dyspnea: no Chest pain: no Lower extremity edema: no Dizzy/lightheaded: no   Relevant past medical, surgical, family and social history reviewed and updated as indicated. Interim medical history since our last visit reviewed. Allergies and medications reviewed and updated.  Review of Systems  Eyes:  Negative for visual disturbance.  Respiratory:  Negative for cough, chest tightness and shortness of breath.   Cardiovascular:  Negative for chest pain, palpitations and leg swelling.  Neurological:  Negative for dizziness and headaches.    Per HPI unless specifically indicated above     Objective:    BP 130/80   Pulse 76   Temp 99.5 F (37.5 C) (Oral)   Ht 5' 4 (1.626 m)   Wt 199 lb 3.2 oz (90.4 kg)   SpO2 95%   BMI 34.19 kg/m   Wt Readings from Last 3 Encounters:  01/26/24 199 lb 3.2 oz (90.4 kg)  12/19/23 194 lb 6.4 oz (88.2 kg)  06/16/23 185 lb 3.2 oz (84 kg)    Physical Exam Vitals and nursing note reviewed.  Constitutional:      General: She is not in acute distress.    Appearance: Normal appearance. She is normal weight. She is not ill-appearing, toxic-appearing or diaphoretic.  HENT:     Head: Normocephalic.     Right Ear: External ear  normal.     Left Ear: External ear normal.     Nose: Nose normal.     Mouth/Throat:     Mouth: Mucous membranes are moist.     Pharynx: Oropharynx is clear.  Eyes:     General:        Right eye: No discharge.        Left eye: No discharge.     Extraocular Movements: Extraocular movements intact.     Conjunctiva/sclera: Conjunctivae normal.     Pupils: Pupils are equal, round, and reactive to light.  Cardiovascular:     Rate and Rhythm: Normal rate and regular rhythm.     Heart sounds: No murmur heard. Pulmonary:     Effort: Pulmonary effort is normal. No respiratory distress.     Breath sounds: Normal breath sounds. No wheezing or rales.  Musculoskeletal:     Cervical back: Normal range of motion and neck supple.  Skin:    General: Skin is warm and dry.     Capillary Refill: Capillary refill takes less than 2 seconds.  Neurological:     General: No focal deficit present.     Mental Status: She is alert and oriented to person, place, and time. Mental status is at baseline.  Psychiatric:        Mood and Affect: Mood normal.  Behavior: Behavior normal.        Thought Content: Thought content normal.        Judgment: Judgment normal.     Results for orders placed or performed in visit on 12/19/23  CBC with Differential/Platelet   Collection Time: 12/19/23  8:37 AM  Result Value Ref Range   WBC 6.2 3.4 - 10.8 x10E3/uL   RBC 4.63 3.77 - 5.28 x10E6/uL   Hemoglobin 14.0 11.1 - 15.9 g/dL   Hematocrit 56.3 65.9 - 46.6 %   MCV 94 79 - 97 fL   MCH 30.2 26.6 - 33.0 pg   MCHC 32.1 31.5 - 35.7 g/dL   RDW 86.7 88.2 - 84.5 %   Platelets 181 150 - 450 x10E3/uL   Neutrophils 64 Not Estab. %   Lymphs 27 Not Estab. %   Monocytes 5 Not Estab. %   Eos 3 Not Estab. %   Basos 1 Not Estab. %   Neutrophils Absolute 4.1 1.4 - 7.0 x10E3/uL   Lymphocytes Absolute 1.7 0.7 - 3.1 x10E3/uL   Monocytes Absolute 0.3 0.1 - 0.9 x10E3/uL   EOS (ABSOLUTE) 0.2 0.0 - 0.4 x10E3/uL   Basophils  Absolute 0.0 0.0 - 0.2 x10E3/uL   Immature Granulocytes 0 Not Estab. %   Immature Grans (Abs) 0.0 0.0 - 0.1 x10E3/uL  Comprehensive metabolic panel with GFR   Collection Time: 12/19/23  8:37 AM  Result Value Ref Range   Glucose 107 (H) 70 - 99 mg/dL   BUN 11 8 - 27 mg/dL   Creatinine, Ser 9.39 0.57 - 1.00 mg/dL   eGFR 896 >40 fO/fpw/8.26   BUN/Creatinine Ratio 18 12 - 28   Sodium 141 134 - 144 mmol/L   Potassium 4.4 3.5 - 5.2 mmol/L   Chloride 106 96 - 106 mmol/L   CO2 21 20 - 29 mmol/L   Calcium  9.3 8.7 - 10.3 mg/dL   Total Protein 6.4 6.0 - 8.5 g/dL   Albumin 4.4 3.8 - 4.9 g/dL   Globulin, Total 2.0 1.5 - 4.5 g/dL   Bilirubin Total 0.4 0.0 - 1.2 mg/dL   Alkaline Phosphatase 119 44 - 121 IU/L   AST 28 0 - 40 IU/L   ALT 20 0 - 32 IU/L  Lipid panel   Collection Time: 12/19/23  8:37 AM  Result Value Ref Range   Cholesterol, Total 183 100 - 199 mg/dL   Triglycerides 853 0 - 149 mg/dL   HDL 53 >60 mg/dL   VLDL Cholesterol Cal 26 5 - 40 mg/dL   LDL Chol Calc (NIH) 895 (H) 0 - 99 mg/dL   Chol/HDL Ratio 3.5 0.0 - 4.4 ratio  TSH   Collection Time: 12/19/23  8:37 AM  Result Value Ref Range   TSH 1.350 0.450 - 4.500 uIU/mL  Hemoglobin A1c   Collection Time: 12/19/23  8:37 AM  Result Value Ref Range   Hgb A1c MFr Bld 5.4 4.8 - 5.6 %   Est. average glucose Bld gHb Est-mCnc 108 mg/dL      Assessment & Plan:   Problem List Items Addressed This Visit       Cardiovascular and Mediastinum   Primary hypertension - Primary   Chronic. Improved with Olmesartan  20mg .  Continue with current medication.  Labs ordered.  Continue to check blood pressures at home.  Follow up in 5 months.  Call sooner if concerns arise.       Relevant Orders   Comp Met (CMET)     Follow  up plan: Return in about 5 months (around 06/27/2024) for HTN, HLD, DM2 FU.

## 2024-01-26 NOTE — Assessment & Plan Note (Signed)
 Chronic. Improved with Olmesartan  20mg .  Continue with current medication.  Labs ordered.  Continue to check blood pressures at home.  Follow up in 5 months.  Call sooner if concerns arise.

## 2024-01-27 ENCOUNTER — Ambulatory Visit: Payer: Self-pay | Admitting: Nurse Practitioner

## 2024-01-27 LAB — COMPREHENSIVE METABOLIC PANEL WITH GFR
ALT: 22 IU/L (ref 0–32)
AST: 33 IU/L (ref 0–40)
Albumin: 4.2 g/dL (ref 3.8–4.9)
Alkaline Phosphatase: 118 IU/L (ref 44–121)
BUN/Creatinine Ratio: 15 (ref 12–28)
BUN: 11 mg/dL (ref 8–27)
Bilirubin Total: 0.4 mg/dL (ref 0.0–1.2)
CO2: 22 mmol/L (ref 20–29)
Calcium: 9.2 mg/dL (ref 8.7–10.3)
Chloride: 105 mmol/L (ref 96–106)
Creatinine, Ser: 0.73 mg/dL (ref 0.57–1.00)
Globulin, Total: 2.2 g/dL (ref 1.5–4.5)
Glucose: 99 mg/dL (ref 70–99)
Potassium: 4.5 mmol/L (ref 3.5–5.2)
Sodium: 141 mmol/L (ref 134–144)
Total Protein: 6.4 g/dL (ref 6.0–8.5)
eGFR: 94 mL/min/1.73 (ref >59)

## 2024-03-01 ENCOUNTER — Encounter: Payer: Self-pay | Admitting: Nurse Practitioner

## 2024-03-01 MED ORDER — OLMESARTAN MEDOXOMIL 20 MG PO TABS: 20.0000 mg | ORAL_TABLET | Freq: Every day | ORAL | 1 refills | Status: AC

## 2024-06-28 ENCOUNTER — Ambulatory Visit: Admitting: Nurse Practitioner
